# Patient Record
Sex: Female | Born: 1975 | Race: White | Hispanic: Yes | Marital: Married | Smoking: Never smoker
Health system: Southern US, Community
[De-identification: ages and names within clinical notes are randomized; demographics above are authoritative.]

## PROBLEM LIST (undated history)

## (undated) DIAGNOSIS — K219 Gastro-esophageal reflux disease without esophagitis: Secondary | ICD-10-CM

## (undated) DIAGNOSIS — K5900 Constipation, unspecified: Secondary | ICD-10-CM

## (undated) HISTORY — DX: Constipation, unspecified: K59.00

## (undated) HISTORY — PX: CHOLECYSTECTOMY: SHX55

---

## 2004-12-22 ENCOUNTER — Ambulatory Visit (HOSPITAL_COMMUNITY): Admission: RE | Admit: 2004-12-22 | Discharge: 2004-12-22 | Payer: Self-pay | Admitting: Family Medicine

## 2004-12-23 ENCOUNTER — Emergency Department (HOSPITAL_COMMUNITY): Admission: EM | Admit: 2004-12-23 | Discharge: 2004-12-23 | Payer: Self-pay | Admitting: Emergency Medicine

## 2004-12-24 ENCOUNTER — Ambulatory Visit (HOSPITAL_COMMUNITY): Admission: RE | Admit: 2004-12-24 | Discharge: 2004-12-24 | Payer: Self-pay | Admitting: Family Medicine

## 2005-11-17 ENCOUNTER — Ambulatory Visit: Payer: Self-pay | Admitting: Internal Medicine

## 2005-11-21 ENCOUNTER — Ambulatory Visit (HOSPITAL_COMMUNITY): Admission: RE | Admit: 2005-11-21 | Discharge: 2005-11-21 | Payer: Self-pay | Admitting: Internal Medicine

## 2005-11-22 ENCOUNTER — Ambulatory Visit: Payer: Self-pay | Admitting: Internal Medicine

## 2005-11-22 ENCOUNTER — Ambulatory Visit (HOSPITAL_COMMUNITY): Admission: RE | Admit: 2005-11-22 | Discharge: 2005-11-22 | Payer: Self-pay | Admitting: Internal Medicine

## 2005-12-27 ENCOUNTER — Ambulatory Visit: Payer: Self-pay | Admitting: Internal Medicine

## 2006-10-20 ENCOUNTER — Ambulatory Visit (HOSPITAL_COMMUNITY): Admission: RE | Admit: 2006-10-20 | Discharge: 2006-10-20 | Payer: Self-pay | Admitting: Gastroenterology

## 2011-12-25 ENCOUNTER — Encounter (HOSPITAL_COMMUNITY): Payer: Self-pay | Admitting: Emergency Medicine

## 2011-12-25 ENCOUNTER — Emergency Department (HOSPITAL_COMMUNITY)
Admission: EM | Admit: 2011-12-25 | Discharge: 2011-12-25 | Disposition: A | Discharge status: Home / Self Care | Payer: No Typology Code available for payment source | Attending: Emergency Medicine | Admitting: Emergency Medicine

## 2011-12-25 ENCOUNTER — Emergency Department (HOSPITAL_COMMUNITY): Payer: No Typology Code available for payment source

## 2011-12-25 DIAGNOSIS — K625 Hemorrhage of anus and rectum: Secondary | ICD-10-CM | POA: Insufficient documentation

## 2011-12-25 DIAGNOSIS — N72 Inflammatory disease of cervix uteri: Secondary | ICD-10-CM | POA: Insufficient documentation

## 2011-12-25 DIAGNOSIS — K219 Gastro-esophageal reflux disease without esophagitis: Secondary | ICD-10-CM | POA: Insufficient documentation

## 2011-12-25 DIAGNOSIS — R6883 Chills (without fever): Secondary | ICD-10-CM | POA: Insufficient documentation

## 2011-12-25 DIAGNOSIS — R109 Unspecified abdominal pain: Secondary | ICD-10-CM | POA: Insufficient documentation

## 2011-12-25 DIAGNOSIS — K644 Residual hemorrhoidal skin tags: Secondary | ICD-10-CM | POA: Insufficient documentation

## 2011-12-25 HISTORY — DX: Gastro-esophageal reflux disease without esophagitis: K21.9

## 2011-12-25 LAB — COMPREHENSIVE METABOLIC PANEL
ALT: 20 U/L (ref 0–35)
Alkaline Phosphatase: 52 U/L (ref 39–117)
BUN: 6 mg/dL (ref 6–23)
CO2: 24 mEq/L (ref 19–32)
Calcium: 9.6 mg/dL (ref 8.4–10.5)
GFR calc Af Amer: >90 mL/min (ref >90)
GFR calc non Af Amer: >90 mL/min (ref >90)
Glucose, Bld: 95 mg/dL (ref 70–99)
Sodium: 138 mEq/L (ref 135–145)
Total Protein: 7.7 g/dL (ref 6.0–8.3)

## 2011-12-25 LAB — URINALYSIS, MICROSCOPIC ONLY
Bilirubin Urine: NEGATIVE
Hgb urine dipstick: NEGATIVE
Nitrite: NEGATIVE
Protein, ur: NEGATIVE mg/dL
Urobilinogen, UA: 0.2 mg/dL (ref 0.0–1.0)

## 2011-12-25 LAB — CBC
HCT: 39.7 % (ref 36.0–46.0)
Hemoglobin: 13.8 g/dL (ref 12.0–15.0)
MCH: 32.3 pg (ref 26.0–34.0)
MCV: 93 fL (ref 78.0–100.0)
Platelets: 210 10*3/uL (ref 150–400)
RBC: 4.27 MIL/uL (ref 3.87–5.11)
WBC: 5 10*3/uL (ref 4.0–10.5)

## 2011-12-25 LAB — WET PREP, GENITAL: Clue Cells Wet Prep HPF POC: NONE SEEN

## 2011-12-25 LAB — DIFFERENTIAL
Eosinophils Absolute: 0 10*3/uL (ref 0.0–0.7)
Eosinophils Relative: 0 % (ref 0–5)
Lymphocytes Relative: 24 % (ref 12–46)
Lymphs Abs: 1.2 10*3/uL (ref 0.7–4.0)
Monocytes Absolute: 0.2 10*3/uL (ref 0.1–1.0)
Monocytes Relative: 4 % (ref 3–12)

## 2011-12-25 LAB — LACTIC ACID, PLASMA: Lactic Acid, Venous: 0.9 mmol/L (ref 0.5–2.2)

## 2011-12-25 LAB — OCCULT BLOOD, POC DEVICE: Fecal Occult Bld: NEGATIVE

## 2011-12-25 MED ORDER — AZITHROMYCIN 1 G PO PACK
1.0000 g | PACK | Freq: Once | ORAL | Status: AC
  Administered 2011-12-25: 1 g via ORAL
  Filled 2011-12-25: qty 1

## 2011-12-25 MED ORDER — HYDROMORPHONE HCL PF 1 MG/ML IJ SOLN
1.0000 mg | Freq: Once | INTRAMUSCULAR | Status: AC
  Administered 2011-12-25: 1 mg via INTRAVENOUS
  Filled 2011-12-25 (×2): qty 1

## 2011-12-25 MED ORDER — SODIUM CHLORIDE 0.9 % IV BOLUS (SEPSIS)
1000.0000 mL | Freq: Once | INTRAVENOUS | Status: AC
  Administered 2011-12-25: 1000 mL via INTRAVENOUS

## 2011-12-25 MED ORDER — IOHEXOL 300 MG/ML  SOLN
20.0000 mL | INTRAMUSCULAR | Status: AC
  Administered 2011-12-25: 20 mL via ORAL

## 2011-12-25 MED ORDER — ONDANSETRON HCL 4 MG/2ML IJ SOLN
4.0000 mg | Freq: Once | INTRAMUSCULAR | Status: AC
  Administered 2011-12-25: 4 mg via INTRAVENOUS
  Filled 2011-12-25 (×2): qty 2

## 2011-12-25 MED ORDER — CEFTRIAXONE SODIUM 250 MG IJ SOLR
250.0000 mg | Freq: Once | INTRAMUSCULAR | Status: AC
  Administered 2011-12-25: 250 mg via INTRAMUSCULAR
  Filled 2011-12-25: qty 250

## 2011-12-25 MED ORDER — PRAMOXINE-ZINC OXIDE IN MO 1-12.5 % RE OINT: TOPICAL_OINTMENT | RECTAL | Status: AC

## 2011-12-25 MED ORDER — POLYETHYLENE GLYCOL 3350 17 G PO PACK: 17.0000 g | PACK | Freq: Every day | ORAL | Status: AC

## 2011-12-25 MED ORDER — IOHEXOL 300 MG/ML  SOLN: 100.0000 mL | Freq: Once | INTRAMUSCULAR | Status: DC | PRN

## 2011-12-25 NOTE — ED Notes (Signed)
Patient is resting comfortably. Reports pain has improved. Appears more comfortable. Bolus complete and patient noted in no acute distress. Family at bedside.

## 2011-12-25 NOTE — ED Notes (Signed)
Pt drinking oral contrast at this time. 

## 2011-12-25 NOTE — ED Notes (Signed)
MD at bedside. 

## 2011-12-25 NOTE — ED Notes (Signed)
Assisted Dr. Meredith Pel with pelvic exam.  Pt updated on plan of care and pending labs d/c.  Pt noted in no acute distress.

## 2011-12-25 NOTE — ED Notes (Signed)
C/o pelvic pain since yesterday that is worse today.   Reports problems with constipation over the past month and is scheduled to see gastroenterologist later in the month.  Pt states she was trying to have BM and had small amount of bright red rectal bleeding with mucous in it.

## 2011-12-25 NOTE — ED Provider Notes (Signed)
History     CSN: [id number]  Arrival date & time 12/25/11  1556   First MD Initiated Contact with Patient 12/25/11 1703      Chief Complaint  Patient presents with  . Rectal Bleeding    (Consider location/radiation/quality/duration/timing/severity/associated sxs/prior treatment) Patient is a 36 y.o. female presenting with hematochezia. The history is provided by the patient.  Rectal Bleeding  The current episode started today. Episode frequency: once. The problem has been unchanged. The pain is moderate. The stool is described as mixed with blood and soft. Associated symptoms include abdominal pain (lower abdominal) and rectal pain. Pertinent negatives include no fever, no diarrhea, no hematemesis, no hemorrhoids, no nausea, no vomiting, no hematuria, no vaginal bleeding, no vaginal discharge, no chest pain, no headaches, no coughing, no difficulty breathing and no rash. She has been behaving normally. She has been eating and drinking normally. Urine output has been normal. Her past medical history is significant for abdominal surgery (cholecystectomy). Her past medical history does not include recent abdominal injury. Past medical history comments: "colitis". There were no sick contacts. She has received no recent medical care.    Past Medical History  Diagnosis Date  . Acid reflux     Past Surgical History  Procedure Date  . Cholecystectomy     No family history on file.  History  Substance Use Topics  . Smoking status: Never Smoker   . Smokeless tobacco: Not on file  . Alcohol Use: No    OB History    Grav Para Term Preterm Abortions TAB SAB Ect Mult Living                  Review of Systems  Constitutional: Positive for chills. Negative for fever, activity change, appetite change and fatigue.  HENT: Negative for congestion, sore throat, rhinorrhea, neck pain and neck stiffness.   Eyes: Negative for photophobia, redness and visual disturbance.  Respiratory:  Negative for cough, shortness of breath and wheezing.   Cardiovascular: Negative for chest pain, palpitations and leg swelling.  Gastrointestinal: Positive for abdominal pain (lower abdominal), blood in stool (mucousy stool today with small amount of bright red blood streaking), hematochezia and rectal pain. Negative for nausea, vomiting, diarrhea, constipation, hematemesis and hemorrhoids.  Genitourinary: Positive for dyspareunia (chronically for years). Negative for dysuria, urgency, hematuria, flank pain, vaginal bleeding and vaginal discharge.  Musculoskeletal: Negative for back pain.  Skin: Negative for rash and wound.  Neurological: Negative for dizziness, seizures, facial asymmetry, speech difficulty, weakness, light-headedness, numbness and headaches.  Psychiatric/Behavioral: Negative for confusion.  All other systems reviewed and are negative.    Allergies  Review of patient's allergies indicates no known allergies.  Home Medications  No current outpatient prescriptions on file.  BP 123/79  Pulse 101  Resp 20  SpO2 100%  LMP 12/04/2011  Physical Exam  Nursing note and vitals reviewed. Constitutional: She is oriented to person, place, and time. She appears well-developed and well-nourished.  Non-toxic appearance. No distress.  HENT:  Head: Normocephalic and atraumatic.  Mouth/Throat: Oropharynx is clear and moist.  Eyes: Conjunctivae and EOM are normal. Pupils are equal, round, and reactive to light. No scleral icterus.  Neck: Normal range of motion. Neck supple. No JVD present.  Cardiovascular: Normal rate, regular rhythm, normal heart sounds and intact distal pulses.   No murmur heard. Pulmonary/Chest: Effort normal and breath sounds normal. No respiratory distress. She has no wheezes. She has no rales.  Abdominal: Soft. Bowel sounds are normal. She  exhibits no distension and no mass. There is tenderness (pain diffusely accross lower abdomen but worse in LLQ). There is no  rebound and no guarding.  Genitourinary: Uterus normal. Rectal exam shows external hemorrhoid (5mm external hemorrhoid at 7 o'clock position). Rectal exam shows no fissure, no mass, no tenderness and anal tone normal. There is no rash, tenderness or lesion on the right labia. There is no rash, tenderness or lesion on the left labia. Cervix exhibits discharge (white) and friability. Cervix exhibits no motion tenderness. Right adnexum displays no mass and no tenderness. Left adnexum displays no mass and no tenderness.  Musculoskeletal: Normal range of motion.  Neurological: She is alert and oriented to person, place, and time. She has normal strength. No cranial nerve deficit. GCS eye subscore is 4. GCS verbal subscore is 5. GCS motor subscore is 6.  Skin: Skin is warm and dry. No rash noted. She is not diaphoretic.  Psychiatric: She has a normal mood and affect.    ED Course  Procedures (including critical care time)  Labs Reviewed  URINALYSIS, WITH MICROSCOPIC - Abnormal; Notable for the following:    Specific Gravity, Urine 1.002 (*)    All other components within normal limits  WET PREP, GENITAL - Abnormal; Notable for the following:    WBC, Wet Prep HPF POC FEW (*)    All other components within normal limits  CBC  DIFFERENTIAL  COMPREHENSIVE METABOLIC PANEL  LACTIC ACID, PLASMA  POCT PREGNANCY, URINE  OCCULT BLOOD, POC DEVICE  GC/CHLAMYDIA PROBE AMP, GENITAL   Ct Abdomen Pelvis W Contrast  12/25/2011  *RADIOLOGY REPORT*  Clinical Data: Rectal bleeding.  CT ABDOMEN AND PELVIS WITH CONTRAST  Technique:  Multidetector CT imaging of the abdomen and pelvis was performed following the standard protocol during bolus administration of intravenous contrast.  Contrast: 1 OMNIPAQUE IOHEXOL 300 MG/ML IV SOLN  Comparison: 11/21/2005  Findings: The lung bases are clear.  The liver is unremarkable.  No focal lesions or biliary dilatation. The gallbladder is surgically absent.  No common bile duct  dilatation.  The pancreas is normal.  The spleen is normal in size. There is a stable splenic cleft.  The adrenal glands and kidneys are normal.  No hydronephrosis or renal or ureteral calculi.  The stomach, duodenum, small bowel and colon are unremarkable. The appendix is normal.  No inflammatory changes or mass lesions to account for the patient's rectal bleeding.  No mesenteric or retroperitoneal masses or adenopathy.  The aorta is normal in caliber.  The major branch vessels are normal.  The uterus and ovaries are unremarkable.  No pelvic mass, adenopathy or free pelvic fluid collections.  The bladder is normal.  No inguinal mass or hernia.  The bony pelvis is intact.  IMPRESSION: No acute abdominal/pelvic findings, mass lesions or adenopathy.  Original Report Authenticated By: P. Loralie Champagne, M.D.     1. External hemorrhoid   2. Cervicitis       MDM  35yo HF with PMH significant for GERD and "colitis" (diagnosed in [place]) presents to the ED due to lower abdominal pain, chills, and mucousy stool today with blood streaking in stool. Has hx of same with "colitis". Also reports chronic pelvic pain with intercourse unchanged for years now. No dysuria or vaginal discharge. LMP 3wks ago. Abdominal exam concerning for possible diverticulitis.   Rectal and pelvic exam performed with female RN chaperone. Rectal exam with small non-thrombosed external hemorrhoid, no bleeding. No fissures. No rectal masses. Cervix with erosions  and friability with some moderate amount of clear-white endocervical discharge. Suspect that the blood streaking she saw on after her BM was actually from her vagina as cervix is friable and with slight bleeding. Discussed results with pt. Will tx empirically for cervicitis. No hx abnormal pap but no pap in 3y. Informed her she will need OBGYN f/u and she will call her obgyn in AM. She is scheduled for GI f/u in approx 2 wks. Will cover with rocephin and azith. GC chlam tests sent.     CT scan neg for diverticulitis or other abnormality.   I saw and evaluated the patient, reviewed the resident's note and I agree with the findings and plan. Pt was evaluated for rectal bleeding.  Abdominal exam is negative.  Lab workup is benign.  Pelvic exam suggests blood was from cervicitis. Osvaldo Human, M.D.     Verne Carrow, MD 12/25/11 2357  Carleene Cooper III, MD 12/27/11 1106

## 2011-12-25 NOTE — ED Notes (Signed)
Dr Ignacia Palma to bedside.

## 2011-12-27 LAB — GC/CHLAMYDIA PROBE AMP, GENITAL: Chlamydia, DNA Probe: NEGATIVE

## 2015-09-30 ENCOUNTER — Encounter: Payer: Self-pay | Admitting: Internal Medicine

## 2015-10-21 ENCOUNTER — Encounter: Payer: Self-pay | Admitting: Gastroenterology

## 2015-10-21 ENCOUNTER — Ambulatory Visit (INDEPENDENT_AMBULATORY_CARE_PROVIDER_SITE_OTHER): Payer: BLUE CROSS/BLUE SHIELD | Admitting: Gastroenterology

## 2015-10-21 ENCOUNTER — Telehealth: Payer: Self-pay | Admitting: Gastroenterology

## 2015-10-21 ENCOUNTER — Other Ambulatory Visit: Payer: Self-pay

## 2015-10-21 VITALS — BP 113/82 | HR 96 | Temp 97.6°F | Ht 66.0 in | Wt 220.0 lb

## 2015-10-21 DIAGNOSIS — K625 Hemorrhage of anus and rectum: Secondary | ICD-10-CM

## 2015-10-21 DIAGNOSIS — K6289 Other specified diseases of anus and rectum: Secondary | ICD-10-CM

## 2015-10-21 DIAGNOSIS — G8929 Other chronic pain: Secondary | ICD-10-CM | POA: Diagnosis not present

## 2015-10-21 DIAGNOSIS — K219 Gastro-esophageal reflux disease without esophagitis: Secondary | ICD-10-CM | POA: Diagnosis not present

## 2015-10-21 MED ORDER — PANTOPRAZOLE SODIUM 40 MG PO TBEC: 40.0000 mg | DELAYED_RELEASE_TABLET | Freq: Every day | ORAL | Status: DC

## 2015-10-21 NOTE — Patient Instructions (Addendum)
We have scheduled you for a colonoscopy with Dr. Jena Gaussourk in the near future.  [facility] is compounding a special cream for your rectum to use 3-4 times a day. Make sure you wear gloves and wash your hands after applying, as it has nitroglycerin in it. This could cause dizziness or headaches.   I have given you samples of Linzess to take once each morning, 30 minutes before breakfast. Please DON'T START until I clear this with a women's health professional regarding breastfeeding and this medication.  I also think you need a stronger reflux medication. As you are breastfeeding, I will find out the best choice for you to use and let you know as soon as possible.   Food Choices for Gastroesophageal Reflux Disease, Adult When you have gastroesophageal reflux disease (GERD), the foods you eat and your eating habits are very important. Choosing the right foods can help ease the discomfort of GERD. WHAT GENERAL GUIDELINES DO I NEED TO FOLLOW?  Choose fruits, vegetables, whole grains, low-fat dairy products, and low-fat meat, fish, and poultry.  Limit fats such as oils, salad dressings, butter, nuts, and avocado.  Keep a food diary to identify foods that cause symptoms.  Avoid foods that cause reflux. These may be different for different people.  Eat frequent small meals instead of three large meals each day.  Eat your meals slowly, in a relaxed setting.  Limit fried foods.  Cook foods using methods other than frying.  Avoid drinking alcohol.  Avoid drinking large amounts of liquids with your meals.  Avoid bending over or lying down until 2-3 hours after eating. WHAT FOODS ARE NOT RECOMMENDED? The following are some foods and drinks that may worsen your symptoms: Vegetables Tomatoes. Tomato juice. Tomato and spaghetti sauce. Chili peppers. Onion and garlic. Horseradish. Fruits Oranges, grapefruit, and lemon (fruit and juice). Meats High-fat meats, fish, and poultry. This  includes hot dogs, ribs, ham, sausage, salami, and bacon. Dairy Whole milk and chocolate milk. Sour cream. Cream. Butter. Ice cream. Cream cheese.  Beverages Coffee and tea, with or without caffeine. Carbonated beverages or energy drinks. Condiments Hot sauce. Barbecue sauce.  Sweets/Desserts Chocolate and cocoa. Donuts. Peppermint and spearmint. Fats and Oils High-fat foods, including [place] fries and potato chips. Other Vinegar. Strong spices, such as black pepper, white pepper, red pepper, cayenne, curry powder, cloves, ginger, and chili powder. The items listed above may not be a complete list of foods and beverages to avoid. Contact your dietitian for more information.   This information is not intended to replace advice given to you by your health care provider. Make sure you discuss any questions you have with your health care provider.   Document Released: 10/31/2005 Document Revised: 11/21/2014 Document Reviewed: 09/04/2013 Elsevier Interactive Patient Education [employer]2016 Elsevier Inc.

## 2015-10-21 NOTE — Progress Notes (Signed)
Primary Care Physician:  Ardyth ManPARK,CHAN M, MD Primary Gastroenterologist:  Dr. Jena Gaussourk   Chief Complaint  Patient presents with  . Rectal Pain  . Hemorrhoids  . Rectal Bleeding    in the past    HPI:   Kathryn Hurley is a 39 y.o. female presenting today at the request of her PCP secondary to rectal pain, rectal bleeding.   Has had rectal pain for several years but now has pain in lower abdomen as well. Has had low-volume hematochezia intermittently but not lately. Rectal pain is present most of the time. States it hurts worse if drinks coffee. Stool has "lines" in them. Occasional constipation. Right now has BM daily. Used go 2-3 X per day but now just once.Feels like it is not as productive as it should be. Avoiding straining. Lower abdominal discomfort is better after good BM but a good BM is rare. Sometimes can't even sleep at night from the pain. Knife-like pain in rectum with defecation. Almost feels like blisters. No unexplained weight loss or lack of appetite. Has reflux, upper abdominal discomfort at times. Not on a PPI currently. Was told to take Tums. Taken Omeprazole prn in the past. Was prescribed Linzess but doesn't know if it has helped much. Has taken fiber in the past. Has taken OTC agents before. Last colonoscopy possibly 10 years ago but doesn't remember where. Has seen a gynecologist without any concerning findings as to etiology for lower abdominal pain.. Last CT in 2013 without any concerning findings. Was having abdominal pain at that time, too. [place] but no procedures completed. Has requested a colonoscopy on multiple occasions but never was scheduled as she was told this was not necessary.   SHE IS BREASTFEEDING HER 5518 month old once each evening.    Past Medical History  Diagnosis Date  . Acid reflux   . Constipation     Past Surgical History  Procedure Laterality Date  . Cholecystectomy      No current outpatient prescriptions on file.   No  current facility-administered medications for this visit.    Allergies as of 10/21/2015  . (No Known Allergies)    Family History  Problem Relation Age of Onset  . Colon cancer Neg Hx     Social History   Social History  . Marital Status: Married    Spouse Name: N/A  . Number of Children: N/A  . Years of Education: N/A   Occupational History  . Not on file.   Social History Main Topics  . Smoking status: Never Smoker   . Smokeless tobacco: Not on file  . Alcohol Use: No  . Drug Use: No  . Sexual Activity: Not on file   Other Topics Concern  . Not on file   Social History Narrative    Review of Systems: Gen: Denies any fever, chills, fatigue, weight loss, lack of appetite.  CV: Denies chest pain, heart palpitations, peripheral edema, syncope.  Resp: Denies shortness of breath at rest or with exertion. Denies wheezing or cough.  GI: see HPI  GU : Denies urinary burning, urinary frequency, urinary hesitancy MS: Denies joint pain, muscle weakness, cramps, or limitation of movement.  Derm: Denies rash, itching, dry skin Psych: Denies depression, anxiety, memory loss, and confusion Heme: Denies bruising, bleeding, and enlarged lymph nodes.  Physical Exam: BP 113/82 mmHg  Pulse 96  Temp(Src) 97.6 F (36.4 C)  Ht 5\' 6"  (1.676 m)  Wt 220 lb (99.791 kg)  BMI 35.53 kg/m2  LMP 10/05/2015 General:   Alert and oriented. Pleasant and cooperative. Well-nourished and well-developed.  Head:  Normocephalic and atraumatic. Eyes:  Without icterus, sclera clear and conjunctiva pink.  Ears:  Normal auditory acuity. Nose:  No deformity, discharge,  or lesions. Mouth:  No deformity or lesions, oral mucosa pink.  Lungs:  Clear to auscultation bilaterally. No wheezes, rales, or rhonchi. No distress.  Heart:  S1, S2 present without murmurs appreciated.  Abdomen:  +BS, soft, non-tender and non-distended. No HSM noted. No guarding or rebound. No masses appreciated.  Rectal:   Deferred  Msk:  Symmetrical without gross deformities. Normal posture. Extremities:  Without  edema. Neurologic:  Alert and  oriented x4;  grossly normal neurologically. Skin:  Intact without significant lesions or rashes. Psych:  Alert and cooperative. Normal mood and affect.

## 2015-10-21 NOTE — Telephone Encounter (Signed)
Please let patient know it is ok to start taking linzess 290 mcg once each day, 30 minutes before breakfast.  I also sent in Protonix to take once each morning, 30 minutes before breakfast.   [facility]

## 2015-10-21 NOTE — Assessment & Plan Note (Signed)
39 year old female with chronic rectal pain, worse with defecation, always present. Intermittent low-volume hematochezia noted. No prior colonoscopy. Query anal fissure, hemorrhoids, unable to exclude other occult etiology. Some unproductive BMs and occasional constipation noted. SHE IS BREASTFEEDING HER 718 month old just in the evening. I have reviewed Linzess and Protonix with a fellow midwife colleague who routinely sees post-partum women. Protonix should be safe for breastfeeding (see GERD); Linzess does not appear to be present in breastmilk. I discussed with patient that after the procedure, she should probably not breastfeed for 24-48 hours.    Proceed with TCS with Dr. Jena Gaussourk in near future: the risks, benefits, and alternatives have been discussed with the patient in detail. The patient states understanding and desires to proceed. Linzess 290 mcg samples provided to patient [facility] hemorrhoid cream with nitro compounded 3-4 times per day prescribed

## 2015-10-21 NOTE — Telephone Encounter (Signed)
Tried to call pt x 2, number has been disconnected.

## 2015-10-21 NOTE — Assessment & Plan Note (Signed)
Intermittent GERD and mild epigastric discomfort in setting of chronic GERD without PPI. No alarm features such as weight loss, N/V, dysphagia. Will trial Protonix once daily. No need for EGD unless persistent symptoms.

## 2015-10-21 NOTE — Assessment & Plan Note (Signed)
Colonoscopy as planned.  

## 2015-10-22 NOTE — Telephone Encounter (Signed)
Letter mailed to the pt. Please schedule ov. 

## 2015-10-22 NOTE — Progress Notes (Signed)
cc'ed to pcp °

## 2015-10-23 ENCOUNTER — Encounter (HOSPITAL_COMMUNITY)
Admission: RE | Disposition: A | Discharge status: Home / Self Care | Payer: Self-pay | Source: Ambulatory Visit | Attending: Internal Medicine

## 2015-10-23 ENCOUNTER — Encounter (HOSPITAL_COMMUNITY): Payer: Self-pay | Admitting: *Deleted

## 2015-10-23 ENCOUNTER — Telehealth: Payer: Self-pay | Admitting: Nurse Practitioner

## 2015-10-23 ENCOUNTER — Ambulatory Visit (HOSPITAL_COMMUNITY)
Admission: RE | Admit: 2015-10-23 | Discharge: 2015-10-23 | Disposition: A | Discharge status: Home / Self Care | Payer: BLUE CROSS/BLUE SHIELD | Source: Ambulatory Visit | Attending: Internal Medicine | Admitting: Internal Medicine

## 2015-10-23 ENCOUNTER — Encounter: Payer: Self-pay | Admitting: Internal Medicine

## 2015-10-23 SURGERY — COLONOSCOPY
Anesthesia: Moderate Sedation

## 2015-10-23 NOTE — Telephone Encounter (Signed)
Please call Shawna OrleansMelanie in End[place] on Monday to reschedule this patient. Was on with RMR today (Friday 10/23/15) but ate 3 full meals the day before.

## 2015-10-23 NOTE — Telephone Encounter (Signed)
APPT MADE AND LETTER SENT  °

## 2015-10-23 NOTE — OR Nursing (Signed)
Patient presented for colonoscopy and had eaten full meals at lunch ( Cong[place]inese food with lo mein, rice, broccoli, egg roll, and chicken ) and dinner (pork, pinto beans, cheese and a tortilla) and a honey bun for a snack yesterday.  She did complete her Moviprep split prep and enema stating that she did not understand that she could not have food.  Procedure cancelled per Dr Jena Gaussourk.  Patient voiced understanding regarding the cancellation and Melanine will follow up Monday with the office.

## 2015-11-02 ENCOUNTER — Other Ambulatory Visit: Payer: Self-pay

## 2015-11-02 DIAGNOSIS — G8929 Other chronic pain: Secondary | ICD-10-CM

## 2015-11-02 DIAGNOSIS — K6289 Other specified diseases of anus and rectum: Principal | ICD-10-CM

## 2015-11-02 MED ORDER — PEG 3350-KCL-NA BICARB-NACL 420 G PO SOLR: 4000.0000 mL | ORAL | Status: DC

## 2015-11-02 NOTE — Telephone Encounter (Signed)
PT IS RESCHEDULED FOR 11/18/15. SHE IS AWARE

## 2015-11-17 ENCOUNTER — Telehealth: Payer: Self-pay

## 2015-11-17 NOTE — Telephone Encounter (Signed)
Pt called to cancel her TCS because she has a family emergency. She will call us when she gets back in town.

## 2015-11-18 ENCOUNTER — Ambulatory Visit (HOSPITAL_COMMUNITY)
Admission: RE | Admit: 2015-11-18 | Payer: BLUE CROSS/BLUE SHIELD | Source: Ambulatory Visit | Admitting: Internal Medicine

## 2015-11-18 ENCOUNTER — Encounter (HOSPITAL_COMMUNITY): Admission: RE | Payer: Self-pay | Source: Ambulatory Visit

## 2015-11-18 SURGERY — COLONOSCOPY
Anesthesia: Moderate Sedation

## 2015-11-19 NOTE — Telephone Encounter (Signed)
Noted. Thanks.

## 2015-12-01 ENCOUNTER — Ambulatory Visit: Payer: BLUE CROSS/BLUE SHIELD | Admitting: Gastroenterology

## 2015-12-18 ENCOUNTER — Ambulatory Visit (INDEPENDENT_AMBULATORY_CARE_PROVIDER_SITE_OTHER): Payer: BLUE CROSS/BLUE SHIELD | Admitting: Gastroenterology

## 2015-12-18 ENCOUNTER — Encounter: Payer: Self-pay | Admitting: Gastroenterology

## 2015-12-18 VITALS — BP 108/73 | HR 93 | Temp 97.6°F | Ht 66.0 in | Wt 220.8 lb

## 2015-12-18 DIAGNOSIS — R1013 Epigastric pain: Secondary | ICD-10-CM | POA: Diagnosis not present

## 2015-12-18 DIAGNOSIS — K625 Hemorrhage of anus and rectum: Secondary | ICD-10-CM | POA: Diagnosis not present

## 2015-12-18 NOTE — Progress Notes (Signed)
    Referring Provider: Ardyth Man, MD Primary Care Physician:  Ardyth Man, MD  Primary GI: Dr. Jena Gauss   Chief Complaint  Patient presents with  . Follow-up  . set up TCS    HPI:   Kathryn Hurley is a 40 y.o. female presenting today with a history of rectal pain and rectal bleeding. Originally seen in Dec 2016 and colonoscopy arranged; however, she had to cancel as she had misunderstood the prep instructions.  Rectal pain worse with defecation, constant. Intermittent low-volume hematochezia. Occasional constipation noted. Started on Linzess and [facility] hemorrhoid cream with nitro compounded in cream sent in Dec 2016. She has had intermittent GERD, mild epigastric discomfort in setting of chronic GERD without PPI in Dec 2016. Started Protonix at last visit with hold on EGD unless persistent symptoms.   [facility] hemorrhoid cream has helped a lot. Protonix has improved symptoms of dyspepsia but still present. No N/V. Spicy foods worsen symptoms, and she has tried to avoid trigger foods. Still with some intermittent, vague LUQ discomfort despite dietary changes. Rare NSAIDs. Try not to take a lot. Avoiding trigger foods, coffee, spicy.   Past Medical History  Diagnosis Date  . Acid reflux   . Constipation     Past Surgical History  Procedure Laterality Date  . Cholecystectomy      Current Outpatient Prescriptions  Medication Sig Dispense Refill  . pantoprazole (PROTONIX) 40 MG tablet Take 1 tablet (40 mg total) by mouth daily. 90 tablet 3   No current facility-administered medications for this visit.    Allergies as of 12/18/2015  . (No Known Allergies)    Family History  Problem Relation Age of Onset  . Colon cancer Neg Hx     Social History   Social History  . Marital Status: Married    Spouse Name: N/A  . Number of Children: N/A  . Years of Education: N/A   Social History Main Topics  . Smoking status: Never Smoker   . Smokeless tobacco:  None  . Alcohol Use: No  . Drug Use: No  . Sexual Activity: Not Asked   Other Topics Concern  . None   Social History Narrative    Review of Systems: Negative unless mentioned in HPI   Physical Exam: BP 108/73 mmHg  Pulse 93  Temp(Src) 97.6 F (36.4 C)  Ht  (1.676 m)  Wt 220 lb 12.8 oz (100.154 kg)  BMI 35.65 kg/m2  LMP 12/07/2015 General:   Alert and oriented. No distress noted. Pleasant and cooperative.  Head:  Normocephalic and atraumatic. Eyes:  Conjuctiva clear without scleral icterus. Mouth:  Oral mucosa pink and moist. Good dentition. No lesions. Heart:  S1, S2 present without murmurs, rubs, or gallops. Regular rate and rhythm. Abdomen:  +BS, soft, non-tender and non-distended. No rebound or guarding. No HSM or masses noted. Rectal: patient declined  Msk:  Symmetrical without gross deformities. Normal posture. Extremities:  Without edema. Neurologic:  Alert and  oriented x4;  grossly normal neurologically. Psych:  Alert and cooperative. Normal mood and affect.

## 2015-12-18 NOTE — Assessment & Plan Note (Signed)
Persistent reflux symptoms and vague LUQ discomfort with just mild improvement on Protonix. Gallbladder absent. No dysphagia. As she has had persistent symptoms despite dietary/behavior modification and PPI, will proceed with EGD at time of colonoscopy to assess for gastritis, PUD  Proceed with upper endoscopy in the near future with Dr. Jena Gauss. The risks, benefits, and alternatives have been discussed in detail with patient. They have stated understanding and desire to proceed.  Continue appropriate lifestyle changes Continue Protonix once daily

## 2015-12-18 NOTE — Patient Instructions (Signed)
Continue to take Protonix once each morning, 30 minutes before breakfast.   You may restart Linzess when you feel you need it.   For the rectal cream, use once in the morning and once at night if needed. You should breastfeed your baby before applying the cream for the evening dose. It is unlikely there is any systemic absorption, but I talked with the pharmacist who says this may be the best way to do it for now.   We have scheduled you for a colonoscopy and upper endoscopy in the near future with Dr. Jena Gauss.

## 2015-12-18 NOTE — Progress Notes (Signed)
CC'ED TO PCP 

## 2015-12-18 NOTE — Assessment & Plan Note (Signed)
40 year old female with chronic rectal pain and bleeding that may be benign source but unable to exclude other occult etiology; query hemorrhoids or fissure. She has noted significant improvement in rectal pain with [facility] Hemorrhoid cream that has nitro compounded into this. I did discuss with the pharmacist about safety with this topical medication in the setting of evening breastfeeding of 15 month old. Low likelihood of any systemic absorption and with very short half-life, it is not felt to be contraindicated. To ensure safety, she has been asked to use this AFTER breastfeeding in the evening if needed. We will arrange a colonoscopy in the near future for further recommendation. She declined a rectal exam today.   Proceed with TCS with Dr. Jena Gauss in near future: the risks, benefits, and alternatives have been discussed with the patient in detail. The patient states understanding and desires to proceed. Continue hemorrhoid cream Linzess 145 mcg daily if recurrent constipation Discussed not breastfeeding for 24-48 hours after procedure (child is 11 months old and only comfort feeds in the evening) May need hemorrhoid banding vs surgical repair depending on clinical findings

## 2015-12-22 ENCOUNTER — Other Ambulatory Visit: Payer: Self-pay

## 2015-12-22 DIAGNOSIS — R1013 Epigastric pain: Secondary | ICD-10-CM

## 2015-12-22 DIAGNOSIS — K625 Hemorrhage of anus and rectum: Secondary | ICD-10-CM

## 2015-12-22 MED ORDER — PEG 3350-KCL-NA BICARB-NACL 420 G PO SOLR: 4000.0000 mL | Freq: Once | ORAL | Status: DC

## 2016-01-08 ENCOUNTER — Encounter (HOSPITAL_COMMUNITY)
Admission: RE | Disposition: A | Discharge status: Home / Self Care | Payer: Self-pay | Source: Ambulatory Visit | Attending: Internal Medicine

## 2016-01-08 ENCOUNTER — Ambulatory Visit (HOSPITAL_COMMUNITY)
Admission: RE | Admit: 2016-01-08 | Discharge: 2016-01-08 | Disposition: A | Discharge status: Home / Self Care | Payer: BLUE CROSS/BLUE SHIELD | Source: Ambulatory Visit | Attending: Internal Medicine | Admitting: Internal Medicine

## 2016-01-08 ENCOUNTER — Encounter (HOSPITAL_COMMUNITY): Payer: Self-pay | Admitting: *Deleted

## 2016-01-08 DIAGNOSIS — R1013 Epigastric pain: Secondary | ICD-10-CM

## 2016-01-08 DIAGNOSIS — K449 Diaphragmatic hernia without obstruction or gangrene: Secondary | ICD-10-CM | POA: Insufficient documentation

## 2016-01-08 DIAGNOSIS — K6289 Other specified diseases of anus and rectum: Secondary | ICD-10-CM | POA: Diagnosis not present

## 2016-01-08 DIAGNOSIS — K5900 Constipation, unspecified: Secondary | ICD-10-CM | POA: Insufficient documentation

## 2016-01-08 DIAGNOSIS — K2950 Unspecified chronic gastritis without bleeding: Secondary | ICD-10-CM | POA: Diagnosis not present

## 2016-01-08 DIAGNOSIS — K921 Melena: Secondary | ICD-10-CM | POA: Insufficient documentation

## 2016-01-08 DIAGNOSIS — K5730 Diverticulosis of large intestine without perforation or abscess without bleeding: Secondary | ICD-10-CM | POA: Diagnosis not present

## 2016-01-08 DIAGNOSIS — Z79899 Other long term (current) drug therapy: Secondary | ICD-10-CM | POA: Insufficient documentation

## 2016-01-08 DIAGNOSIS — K3189 Other diseases of stomach and duodenum: Secondary | ICD-10-CM | POA: Diagnosis not present

## 2016-01-08 DIAGNOSIS — K219 Gastro-esophageal reflux disease without esophagitis: Secondary | ICD-10-CM | POA: Insufficient documentation

## 2016-01-08 DIAGNOSIS — K625 Hemorrhage of anus and rectum: Secondary | ICD-10-CM | POA: Diagnosis not present

## 2016-01-08 HISTORY — PX: BIOPSY: SHX5522

## 2016-01-08 HISTORY — PX: ESOPHAGOGASTRODUODENOSCOPY: SHX5428

## 2016-01-08 HISTORY — PX: COLONOSCOPY: SHX5424

## 2016-01-08 SURGERY — COLONOSCOPY
Anesthesia: Moderate Sedation

## 2016-01-08 MED ORDER — MEPERIDINE HCL 100 MG/ML IJ SOLN
INTRAMUSCULAR | Status: DC | PRN
  Administered 2016-01-08: 25 mg via INTRAVENOUS
  Administered 2016-01-08: 50 mg via INTRAVENOUS
  Administered 2016-01-08 (×2): 25 mg via INTRAVENOUS
  Administered 2016-01-08: 50 mg via INTRAVENOUS

## 2016-01-08 MED ORDER — SODIUM CHLORIDE 0.9 % IV SOLN
INTRAVENOUS | Status: DC
  Administered 2016-01-08: 07:00:00 via INTRAVENOUS

## 2016-01-08 MED ORDER — MEPERIDINE HCL 100 MG/ML IJ SOLN
INTRAMUSCULAR | Status: AC
  Filled 2016-01-08: qty 2

## 2016-01-08 MED ORDER — LIDOCAINE VISCOUS 2 % MT SOLN
OROMUCOSAL | Status: AC
  Filled 2016-01-08: qty 15

## 2016-01-08 MED ORDER — ONDANSETRON HCL 4 MG/2ML IJ SOLN
INTRAMUSCULAR | Status: AC
  Filled 2016-01-08: qty 2

## 2016-01-08 MED ORDER — MIDAZOLAM HCL 5 MG/5ML IJ SOLN
INTRAMUSCULAR | Status: DC | PRN
  Administered 2016-01-08 (×2): 1 mg via INTRAVENOUS
  Administered 2016-01-08 (×3): 2 mg via INTRAVENOUS
  Administered 2016-01-08: 1 mg via INTRAVENOUS

## 2016-01-08 MED ORDER — LIDOCAINE VISCOUS 2 % MT SOLN
OROMUCOSAL | Status: DC | PRN
  Administered 2016-01-08: 5 mL via OROMUCOSAL

## 2016-01-08 MED ORDER — ONDANSETRON HCL 4 MG/2ML IJ SOLN
INTRAMUSCULAR | Status: DC | PRN
  Administered 2016-01-08: 4 mg via INTRAVENOUS

## 2016-01-08 MED ORDER — MIDAZOLAM HCL 5 MG/5ML IJ SOLN
INTRAMUSCULAR | Status: AC
  Filled 2016-01-08: qty 10

## 2016-01-08 NOTE — H&P (View-Only) (Signed)
    Referring Provider: Ardyth Man, MD Primary Care Physician:  Ardyth Man, MD  Primary GI: Dr. Jena Gauss   Chief Complaint  Patient presents with  . Follow-up  . set up TCS    HPI:   Kathryn Hurley is a 40 y.o. female presenting today with a history of rectal pain and rectal bleeding. Originally seen in Dec 2016 and colonoscopy arranged; however, she had to cancel as she had misunderstood the prep instructions.  Rectal pain worse with defecation, constant. Intermittent low-volume hematochezia. Occasional constipation noted. Started on Linzess and [facility] hemorrhoid cream with nitro compounded in cream sent in Dec 2016. She has had intermittent GERD, mild epigastric discomfort in setting of chronic GERD without PPI in Dec 2016. Started Protonix at last visit with hold on EGD unless persistent symptoms.   [facility] hemorrhoid cream has helped a lot. Protonix has improved symptoms of dyspepsia but still present. No N/V. Spicy foods worsen symptoms, and she has tried to avoid trigger foods. Still with some intermittent, vague LUQ discomfort despite dietary changes. Rare NSAIDs. Try not to take a lot. Avoiding trigger foods, coffee, spicy.   Past Medical History  Diagnosis Date  . Acid reflux   . Constipation     Past Surgical History  Procedure Laterality Date  . Cholecystectomy      Current Outpatient Prescriptions  Medication Sig Dispense Refill  . pantoprazole (PROTONIX) 40 MG tablet Take 1 tablet (40 mg total) by mouth daily. 90 tablet 3   No current facility-administered medications for this visit.    Allergies as of 12/18/2015  . (No Known Allergies)    Family History  Problem Relation Age of Onset  . Colon cancer Neg Hx     Social History   Social History  . Marital Status: Married    Spouse Name: N/A  . Number of Children: N/A  . Years of Education: N/A   Social History Main Topics  . Smoking status: Never Smoker   . Smokeless tobacco:  None  . Alcohol Use: No  . Drug Use: No  . Sexual Activity: Not Asked   Other Topics Concern  . None   Social History Narrative    Review of Systems: Negative unless mentioned in HPI   Physical Exam: BP 108/73 mmHg  Pulse 93  Temp(Src) 97.6 F (36.4 C)  Ht  (1.676 m)  Wt 220 lb 12.8 oz (100.154 kg)  BMI 35.65 kg/m2  LMP 12/07/2015 General:   Alert and oriented. No distress noted. Pleasant and cooperative.  Head:  Normocephalic and atraumatic. Eyes:  Conjuctiva clear without scleral icterus. Mouth:  Oral mucosa pink and moist. Good dentition. No lesions. Heart:  S1, S2 present without murmurs, rubs, or gallops. Regular rate and rhythm. Abdomen:  +BS, soft, non-tender and non-distended. No rebound or guarding. No HSM or masses noted. Rectal: patient declined  Msk:  Symmetrical without gross deformities. Normal posture. Extremities:  Without edema. Neurologic:  Alert and  oriented x4;  grossly normal neurologically. Psych:  Alert and cooperative. Normal mood and affect.

## 2016-01-08 NOTE — Interval H&P Note (Signed)
History and Physical Interval Note:  01/08/2016 7:33 AM  Kathryn Hurley  has presented today for surgery, with the diagnosis of rectal bleeding, dyspepsia  The various methods of treatment have been discussed with the patient and family. After consideration of risks, benefits and other options for treatment, the patient has consented to  Procedure(s) with comments: COLONOSCOPY (N/A) - 0730 ESOPHAGOGASTRODUODENOSCOPY (EGD) (N/A) as a surgical intervention .  The patient's history has been reviewed, patient examined, no change in status, stable for surgery.  I have reviewed the patient's chart and labs.  Questions were answered to the patient's satisfaction.     Kathryn Hurley  No change from EGD and colonoscopy per plan.The risks, benefits, limitations, imponderables and alternatives regarding both EGD and colonoscopy have been reviewed with the patient. Questions have been answered. All parties agreeable.

## 2016-01-08 NOTE — Discharge Instructions (Signed)
EGD °Discharge instructions °Please read the instructions outlined below and refer to this sheet in the next few weeks. These discharge instructions provide you with general information on caring for yourself after you leave the hospital. Your doctor may also give you specific instructions. While your treatment has been planned according to the most current medical practices available, unavoidable complications occasionally occur. If you have any problems or questions after discharge, please call your doctor. °ACTIVITY °· You may resume your regular activity but move at a slower pace for the next 24 hours.  °· Take frequent rest periods for the next 24 hours.  °· Walking will help expel (get rid of) the air and reduce the bloated feeling in your abdomen.  °· No driving for 24 hours (because of the anesthesia (medicine) used during the test).  °· You may shower.  °· Do not sign any important legal documents or operate any machinery for 24 hours (because of the anesthesia used during the test).  °NUTRITION °· Drink plenty of fluids.  °· You may resume your normal diet.  °· Begin with a light meal and progress to your normal diet.  °· Avoid alcoholic beverages for 24 hours or as instructed by your caregiver.  °MEDICATIONS °· You may resume your normal medications unless your caregiver tells you otherwise.  °WHAT YOU CAN EXPECT TODAY °· You may experience abdominal discomfort such as a feeling of fullness or “gas” pains.  °FOLLOW-UP °· Your doctor will discuss the results of your test with you.  °SEEK IMMEDIATE MEDICAL ATTENTION IF ANY OF THE FOLLOWING OCCUR: °· Excessive nausea (feeling sick to your stomach) and/or vomiting.  °· Severe abdominal pain and distention (swelling).  °· Trouble swallowing.  °· Temperature over 101° F (37.8º C).  °Rectal bleeding or vomiting of blood.  ° °Colonoscopy °Discharge Instructions ° °Read the instructions outlined below and refer to this sheet in the next few weeks. These discharge  instructions provide you with general information on caring for yourself after you leave the hospital. Your doctor may also give you specific instructions. While your treatment has been planned according to the most current medical practices available, unavoidable complications occasionally occur. If you have any problems or questions after discharge, call Dr. Anuoluwapo Mefferd at [phone]. °ACTIVITY °· You may resume your regular activity, but move at a slower pace for the next 24 hours.  °· Take frequent rest periods for the next 24 hours.  °· Walking will help get rid of the air and reduce the bloated feeling in your belly (abdomen).  °· No driving for 24 hours (because of the medicine (anesthesia) used during the test).   °· Do not sign any important legal documents or operate any machinery for 24 hours (because of the anesthesia used during the test).  °NUTRITION °· Drink plenty of fluids.  °· You may resume your normal diet as instructed by your doctor.  °· Begin with a light meal and progress to your normal diet. Heavy or fried foods are harder to digest and may make you feel sick to your stomach (nauseated).  °· Avoid alcoholic beverages for 24 hours or as instructed.  °MEDICATIONS °· You may resume your normal medications unless your doctor tells you otherwise.  °WHAT YOU CAN EXPECT TODAY °· Some feelings of bloating in the abdomen.  °· Passage of more gas than usual.  °· Spotting of blood in your stool or on the toilet paper.  °IF YOU HAD POLYPS REMOVED DURING THE COLONOSCOPY: °·   No aspirin products for 7 days or as instructed.  °· No alcohol for 7 days or as instructed.  °· Eat a soft diet for the next 24 hours.  °FINDING OUT THE RESULTS OF YOUR TEST °Not all test results are available during your visit. If your test results are not back during the visit, make an appointment with your caregiver to find out the results. Do not assume everything is normal if you have not heard from your caregiver or the medical  facility. It is important for you to follow up on all of your test results.  °SEEK IMMEDIATE MEDICAL ATTENTION IF: °· You have more than a spotting of blood in your stool.  °· Your belly is swollen (abdominal distention).  °· You are nauseated or vomiting.  °· You have a temperature over 101.  °· You have abdominal pain or discomfort that is severe or gets worse throughout the day.  ° ° °Further recommendations to follow pending review of pathology report ° ° °

## 2016-01-08 NOTE — Progress Notes (Signed)
Still waiting on patients family member to arrive to go over discharge instructions

## 2016-01-08 NOTE — Op Note (Signed)
[facility] [address] [place], [address]   ENDOSCOPY PROCEDURE REPORT  PATIENT: Kathryn, Hurley  MR#: [id number] BIRTHDATE: [birth year] , 39  yrs. old GENDER: female ENDOSCOPIST: R.  Roetta Sessions, MD FACP FACG REFERRED BY:  Meredith Mody, M.D. PROCEDURE DATE:  January 31, 2016 PROCEDURE:  EGD w/ biopsy INDICATIONS:  Dyspepsia. MEDICATIONS: Versed 6 mg IV and Demerol 150 mg IV in divided doses. Zofran 4 mg IV.  Xylocaine gel orally. ASA CLASS:      Class II  CONSENT: The risks, benefits, limitations, alternatives and imponderables have been discussed.  The potential for biopsy, esophogeal dilation, etc. have also been reviewed.  Questions have been answered.  All parties agreeable.  Please see the history and physical in the medical record for more information.  DESCRIPTION OF PROCEDURE: After the risks benefits and alternatives of the procedure were thoroughly explained, informed consent was obtained.  The [id number] ([id number]) endoscope was introduced through the mouth and advanced to the second portion of the duodenum , limited by Without limitations. The instrument was slowly withdrawn as the mucosa was fully examined. Estimated blood loss is zero unless otherwise noted in this procedure report.    Normal-appearing tubular esophagus.  Stomach empty.  2 cm hiatal hernia present.  Some nodularity and antral erosions present.  No ulcer or infiltrating process.  Patent pylorus.  Normal-appearing first and second portion of the duodenum.  Retroflexed views revealed a hiatal hernia.    biopsies of the abnormal gastric mucosa taken. The scope was then withdrawn from the patient and the procedure completed.  COMPLICATIONS: There were no immediate complications. EBL 50 mL ENDOSCOPIC IMPRESSION: Gastric erosions?"status post biopsy. Hiatal hernia.  RECOMMENDATIONS: Follow-up on pathology. See colonoscopy report.  REPEAT EXAM:  eSigned:  R. Roetta Sessions, MD Jerrel Ivory  [facility] 01/31/16 8:01 AM    CC:  CPT CODES: ICD CODES:  The ICD and CPT codes recommended by this software are interpretations from the data that the clinical staff has captured with the software.  The verification of the translation of this report to the ICD and CPT codes and modifiers is the sole responsibility of the health care institution and practicing physician where this report was generated.  [employer] will not be held responsible for the validity of the ICD and CPT codes included on this report.  AMA assumes no liability for data contained or not contained herein. CPT is a Publishing rights manager of the [employer].  PATIENT NAME:  Kathryn, Hurley MR#: [id number]

## 2016-01-08 NOTE — Op Note (Signed)
[facility] [address] [place], [address]   COLONOSCOPY PROCEDURE REPORT  PATIENT: Kathryn Hurley, Kathryn Hurley  MR#: [id number] BIRTHDATE: Apr 09, 1976 , 39  yrs. old GENDER: female ENDOSCOPIST: R.  Roetta Sessions, MD FACP [facility] REFERRED UU:[id number] Pleas Patricia, M.D. PROCEDURE DATE:  02-02-2016 PROCEDURE:   Colonoscopy, diagnostic INDICATIONS:Paper hematochezia. MEDICATIONS: Versed 10 mg IV and Demerol 175 mg IV in divided doses. Zofran 4 mg IV. ASA CLASS:       Class II  CONSENT: The risks, benefits, alternatives and imponderables including but not limited to bleeding, perforation as well as the possibility of a missed lesion have been reviewed.  The potential for biopsy, lesion removal, etc. have also been discussed. Questions have been answered.  All parties agreeable.  Please see the history and physical in the medical record for more information.  DESCRIPTION OF PROCEDURE:   After the risks benefits and alternatives of the procedure were thoroughly explained, informed consent was obtained.  The digital rectal exam revealed no abnormalities of the rectum.   The [id number] ([id number])  endoscope was introduced through the anus and advanced to the cecum, which was identified by both the appendix and ileocecal valve. No adverse events experienced.   The quality of the prep was adequate  The instrument was then slowly withdrawn as the colon was fully examined. Estimated blood loss is zero unless otherwise noted in this procedure report.      COLON FINDINGS: Normal-appearing rectal mucosa.  Normal-appearing colonic mucosa.  Retroflexion was performed. .  Withdrawal time=6 minutes 0 seconds.  The scope was withdrawn and the procedure completed. COMPLICATIONS: There were no immediate complications.  ENDOSCOPIC IMPRESSION: Normal colonoscopy  RECOMMENDATIONS: See EGD report  eSigned:  R. Roetta Sessions, MD Jerrel Ivory [facility] Feb 02, 2016 8:18 AM   cc:  CPT CODES: ICD CODES:  The  ICD and CPT codes recommended by this software are interpretations from the data that the clinical staff has captured with the software.  The verification of the translation of this report to the ICD and CPT codes and modifiers is the sole responsibility of the health care institution and practicing physician where this report was generated.  [employer] will not be held responsible for the validity of the ICD and CPT codes included on this report.  AMA assumes no liability for data contained or not contained herein. CPT is a Publishing rights manager of the [employer].

## 2016-01-11 DIAGNOSIS — K5730 Diverticulosis of large intestine without perforation or abscess without bleeding: Secondary | ICD-10-CM | POA: Insufficient documentation

## 2016-01-11 DIAGNOSIS — K449 Diaphragmatic hernia without obstruction or gangrene: Secondary | ICD-10-CM | POA: Insufficient documentation

## 2016-01-11 DIAGNOSIS — K3189 Other diseases of stomach and duodenum: Secondary | ICD-10-CM | POA: Insufficient documentation

## 2016-01-12 ENCOUNTER — Encounter: Payer: Self-pay | Admitting: Internal Medicine

## 2016-01-12 ENCOUNTER — Encounter (HOSPITAL_COMMUNITY): Payer: Self-pay | Admitting: Internal Medicine

## 2016-01-13 ENCOUNTER — Encounter: Payer: Self-pay | Admitting: Internal Medicine

## 2016-02-16 ENCOUNTER — Ambulatory Visit: Payer: BLUE CROSS/BLUE SHIELD | Admitting: Gastroenterology

## 2018-10-10 ENCOUNTER — Encounter: Payer: Self-pay | Admitting: Nurse Practitioner

## 2018-10-10 ENCOUNTER — Ambulatory Visit: Payer: Self-pay | Admitting: Nurse Practitioner

## 2018-10-10 VITALS — BP 115/77 | HR 90 | Temp 97.4°F | Ht 66.0 in | Wt 224.4 lb

## 2018-10-10 DIAGNOSIS — K5900 Constipation, unspecified: Secondary | ICD-10-CM

## 2018-10-10 DIAGNOSIS — G8929 Other chronic pain: Secondary | ICD-10-CM

## 2018-10-10 DIAGNOSIS — K6289 Other specified diseases of anus and rectum: Secondary | ICD-10-CM

## 2018-10-10 DIAGNOSIS — K219 Gastro-esophageal reflux disease without esophagitis: Secondary | ICD-10-CM

## 2018-10-10 DIAGNOSIS — K625 Hemorrhage of anus and rectum: Secondary | ICD-10-CM

## 2018-10-10 MED ORDER — PANTOPRAZOLE SODIUM 40 MG PO TBEC: 40.0000 mg | DELAYED_RELEASE_TABLET | Freq: Every day | ORAL | 3 refills | Status: DC

## 2018-10-10 NOTE — Assessment & Plan Note (Signed)
The patient describes intermittent, low-volume hematochezia again.  This is in the setting of constipation and what she feels to be internal hemorrhoids.  Her last episode was about a month ago.  Colonoscopy was done about 2 and half years ago and unremarkable.  We will set her up for banding visit for anoscopy and evaluation with banding if appropriate.

## 2018-10-10 NOTE — Assessment & Plan Note (Signed)
Difficult to determine if the patient is having true rectal pain or not.  She describes it as her "bottom, not her bottom bottom but her bottom."  She seems to have a history of hemorrhoids.  She had a colonoscopy 2-1/2 years ago which did not show very impressive internal hemorrhoids.  However, with constipation she could have developed these in the interim.  She was previously placed on apothecary cream with nitroglycerin.  Her symptoms are mostly burning and no sharp pains.  I will send in apothecary cream with lidocaine.  We will have her set up with a banding provider to do an anoscopy and internal hemorrhoid banding, if appropriate.

## 2018-10-10 NOTE — Assessment & Plan Note (Signed)
The patient describes esophageal burning, throat burning, upper abdominal discomfort.  She has a chronic history of GERD and is not on any PPI.  She is only taking Tums as needed.  I will restart her on Protonix 40 mg daily.  Return for follow-up 4 months.

## 2018-10-10 NOTE — Patient Instructions (Signed)
1. I sent Protonix 40 mg to your pharmacy.  Take this once a day, on an empty stomach to help your heartburn. 2. I am giving you samples of Linzess 72 mcg.  Take this once a day, on an empty stomach. 3. Call us in 1 week and let us know if the Linzess is helping you have softer bowel movements and empty more completely. 4. I sent in a prescription for hemorrhoid cream to help with your burning for now. 5. I will have you visit with Tobi Bastosnna to evaluate for internal hemorrhoids and consider banding at that time if it seems that it would help.  At [facility] we value your feedback. You may receive a survey about your visit today. Please share your experience as we strive to create trusting relationships with our patients to provide genuine, compassionate, quality care.  We appreciate your understanding and patience as we review any laboratory studies, imaging, and other diagnostic tests that are ordered as we care for you. Our office policy is 5 business days for review of these results, and any emergent or urgent results are addressed in a timely manner for your best interest. If you do not hear from our office in 1 week, please contact us.   We also encourage the use of MyChart, which contains your medical information for your review as well. If you are not enrolled in this feature, an access code is on this after visit summary for your convenience. Thank you for allowing us to be involved in your care.  It was great to see you today!  I hope you have a Happy Thanksgiving!!

## 2018-10-10 NOTE — Assessment & Plan Note (Signed)
The patient describes what seems to be constipation.  She does not have straining.  However, she does have hard stools and incomplete emptying.  She was previously on Linzess and seem to be doing well on this.  I will restart her on Linzess 72 mcg daily.  I will give samples and asked her to call us in 1 to 2 weeks and let us know if it is helping her have softer bowel movements.

## 2018-10-10 NOTE — Progress Notes (Signed)
Referring Provider: Ardyth ManPark, Chan M, MD Primary Care Physician:  Ardyth ManPark, Chan M, MD Primary GI:  Dr. Jena Gaussourk  Chief Complaint  Patient presents with  . Hemorrhoids    some bleeding 1 month ago; strains with bm  . Abdominal Pain    mid lower  . burning in chest    HPI:   Kathryn Hurley is a 42 y.o. female who presents for hemorrhoids, abdominal pain, chest burning.  The patient has not been seen by our office since 12/18/2015 for rectal bleeding and dyspepsia.  At that time noted history of rectal pain and rectal bleeding with colonoscopy originally arranged but had to cancel.  Her rectal pain was worse with defecation and noted intermittent low-volume hematochezia and occasional constipation.  She was started on Linzess and [facility] cream with nitro compounded.  Intermittent GERD with epigastric discomfort but without PPI.  Started Protonix at a previous visit with hold on EGD unless persistent symptoms.  At her last visit she stated that apothecary cream helped a lot, Protonix improved symptoms of dyspepsia but still present.  No nausea or vomiting.  Spicy foods worsens her symptoms.  Intermittent, vague left upper quadrant discomfort despite dietary changes.  Rare NSAIDs.  Trying to avoid triggers.  Recommended continue Protonix daily, start Linzess.  Also recommended colonoscopy and upper endoscopy.  Colonoscopy completed 01/08/2016 which found a normal colonoscopy.  EGD completed the same day found gastric erosions status post biopsy, hiatal hernia.  Surgical pathology found the biopsies to be chronic gastritis without H. pylori.  Recommended continue current medications.  The patient has not been seen by our office since.  Patient is a difficult historian. Today she states she's having worsening of her symptoms. Having hemorrhoids flaring. Described "burning" in her back (lower to mid back), occasional to minimal rectal burning but not in a while. Thinks her hemorrhoids are internal and  not external. The rectal cream didn't help her symptoms before. Tried OTC Preparation H which didn't help. Also with some hematochezia about a month ago. Having some constipation symptoms, stools sometimes hard, incomplete emptying. Occasionally some diarrhea associated with her constipation/hard stools. Will have diarrhea with coffee or spicy foods as well. Has esophageal and throat burning, LUQ pain. Denies chest pain, dyspnea, dizziness, lightheadedness, syncope, near syncope. Denies any other upper or lower GI symptoms.  Overall it seems she has has gluteal burning? Sometimes back pain.  Past Medical History:  Diagnosis Date  . Acid reflux   . Constipation     Past Surgical History:  Procedure Laterality Date  . BIOPSY N/A 01/08/2016   Procedure: BIOPSY;  Surgeon: Corbin Adeobert M Rourk, MD;  [facility];  Service: Endoscopy;  Laterality: N/A;  Antral erosions  . CHOLECYSTECTOMY    . COLONOSCOPY N/A 01/08/2016   RMR: normal  . ESOPHAGOGASTRODUODENOSCOPY N/A 01/08/2016   RMR: Gastric erosion s/p bx/HH    Current Outpatient Medications  Medication Sig Dispense Refill  . calcium carbonate (TUMS - DOSED IN MG ELEMENTAL CALCIUM) 500 MG chewable tablet Chew 3 tablets by mouth as needed for indigestion or heartburn.    . pantoprazole (PROTONIX) 40 MG tablet Take 1 tablet (40 mg total) by mouth daily. (Patient not taking: Reported on 10/10/2018) 90 tablet 3   No current facility-administered medications for this visit.     Allergies as of 10/10/2018  . (No Known Allergies)    Family History  Problem Relation Age of Onset  . Colon cancer Neg Hx  Social History   Socioeconomic History  . Marital status: Married    Spouse name: Not on file  . Number of children: Not on file  . Years of education: Not on file  . Highest education level: Not on file  Occupational History  . Not on file  Social Needs  . Financial resource strain: Not on file  . Food insecurity:    Worry:  Not on file    Inability: Not on file  . Transportation needs:    Medical: Not on file    Non-medical: Not on file  Tobacco Use  . Smoking status: Never Smoker  . Smokeless tobacco: Never Used  Substance and Sexual Activity  . Alcohol use: No    Alcohol/week: 0.0 standard drinks  . Drug use: No  . Sexual activity: Not on file  Lifestyle  . Physical activity:    Days per week: Not on file    Minutes per session: Not on file  . Stress: Not on file  Relationships  . Social connections:    Talks on phone: Not on file    Gets together: Not on file    Attends religious service: Not on file    Active member of club or organization: Not on file    Attends meetings of clubs or organizations: Not on file    Relationship status: Not on file  Other Topics Concern  . Not on file  Social History Narrative  . Not on file    Review of Systems: General: Negative for anorexia, weight loss, fever, chills, fatigue, weakness. ENT: Negative for hoarseness, difficulty swallowing , nasal congestion. CV: Negative for chest pain, angina, palpitations, dyspnea on exertion, peripheral edema.  Respiratory: Negative for dyspnea at rest, dyspnea on exertion, cough, sputum, wheezing.  GI: See history of present illness. Endo: Negative for unusual weight change.  Heme: Negative for bruising or bleeding.   Physical Exam: BP 115/77   Pulse 90   Temp (!) 97.4 F (36.3 C) (Oral)   Ht 5\' 6"  (1.676 m)   Wt 224 lb 6.4 oz (101.8 kg)   LMP 10/03/2018 (Approximate)   BMI 36.22 kg/m  General:   Alert and oriented. Pleasant and cooperative. Well-nourished and well-developed.  Eyes:  Without icterus, sclera clear and conjunctiva pink.  Ears:  Normal auditory acuity. Cardiovascular:  S1, S2 present without murmurs appreciated. Extremities without clubbing or edema. Respiratory:  Clear to auscultation bilaterally. No wheezes, rales, or rhonchi. No distress.  Gastrointestinal:  +BS, soft, non-tender and  non-distended. No HSM noted. No guarding or rebound. No masses appreciated.  Rectal:  Deferred  Musculoskalatal:  Symmetrical without gross deformities. Neurologic:  Alert and oriented x4;  grossly normal neurologically. Psych:  Alert and cooperative. Normal mood and affect. Heme/Lymph/Immune: No excessive bruising noted.    10/10/2018 1:50 PM   Disclaimer: This note was dictated with voice recognition software. Similar sounding words can inadvertently be transcribed and may not be corrected upon review.

## 2018-10-15 NOTE — Progress Notes (Signed)
cc'ed to pcp °

## 2018-11-21 ENCOUNTER — Encounter: Payer: Self-pay | Admitting: Internal Medicine

## 2018-12-10 ENCOUNTER — Encounter: Payer: Self-pay | Admitting: Gastroenterology

## 2018-12-10 ENCOUNTER — Ambulatory Visit (INDEPENDENT_AMBULATORY_CARE_PROVIDER_SITE_OTHER): Payer: Self-pay | Admitting: Gastroenterology

## 2018-12-10 VITALS — BP 119/73 | HR 80 | Temp 97.0°F | Ht 66.0 in | Wt 224.2 lb

## 2018-12-10 DIAGNOSIS — K641 Second degree hemorrhoids: Secondary | ICD-10-CM | POA: Insufficient documentation

## 2018-12-10 NOTE — Patient Instructions (Signed)
Avoid straining.  Take Benefiber 2 teaspoons twice daily  Limit toilet time to 2-3 minutes  Call me with any problems in the meantime.   I recommend using the cream you have and also lidocaine ointment from over-the-counter as needed.   Please schedule followup appointment in 2-3 weeks from now.  It was a pleasure to see you today. I strive to create trusting relationships with patients to provide genuine, compassionate, and quality care. I value your feedback. If you receive a survey regarding your visit,  I greatly appreciate you taking time to fill this out.   Gelene Mink, PhD, ANP-BC [facility]

## 2018-12-10 NOTE — Progress Notes (Signed)
  [facility] Banding Note:   43 year old female with rectal bleeding recently in setting of constipation, seen last in the office Nov 2019. Colonoscopy normal in 2017. Here today for possible banding. She describes sensation of pressure, intermittent rectal bleeding, itching, burning. Feels like something falls out after BMs but reduces spontaneously. She has not had to manually reduce anything. Occasional feeling of spasms not associated with BMs. No recent rectal exam.   I initially examined patient first before determining candidacy for banding. No external abnormalities. She had no discomfort or pain on digital rectal exam. There was no mass. Anoscopy then performed with patient in the left lateral position with Grade 2 hemorrhoids. Appears right posterior column more pronounced with some friability. She does note sensation of right-sided prolapsing when questioned. No mass.   The patient presents with symptomatic grade 2 hemorrhoids, unresponsive to maximal medical therapy, requesting rubber band ligation of her hemorrhoidal disease. All risks, benefits, and alternative forms of therapy were described and informed consent was obtained.  The decision was made to band the  RIGHT POSTERIOR internal hemorrhoid, and the [facility] was used to perform band ligation without complication. Digital anorectal examination was then performed to assure proper positioning of the band, and no adjusting of the band was needed. The patient was discharged home without pain or other issues. Dietary and behavioral recommendations were given, and she was advised to continue anusol cream and lidocaine prn as well. The patient will return in early March for followup and possible additional banding as required.  No complications were encountered and the patient tolerated the procedure well.  Gelene MinkAnna W. Haron Beilke, PhD, ANP-BC [facility]

## 2019-01-15 NOTE — Progress Notes (Signed)
[facility] Banding Note:    43 year old female with history of rectal bleeding in setting of constipation, normal colonoscopy in 2017, with symptomatic Grade 1-2 hemorrhoids, s/p initial banding of right posterior internal hemorrhoid Jan 2020.    The patient presents with symptomatic grade 1 hemorrhoids, unresponsive to maximal medical therapy, requesting rubber band ligation of his/her hemorrhoidal disease. She has noted improvement from initial banding. Notes more pressure on the left side of rectum. All risks, benefits, and alternative forms of therapy were described and informed consent was obtained.  The decision was made to band the left lateral internal hemorrhoid, and the [facility] System was used to perform band ligation without complication. Digital anorectal examination was then performed to assure proper positioning of the band, and no adjustment was required. The patient was discharged home without pain or other issues. Dietary and behavioral recommendations were given along with follow-up instructions. The patient will return in 2-3 weeks  for followup and possible additional banding as required.  No complications were encountered and the patient tolerated the procedure well.  Gelene Mink, PhD, ANP-BC [facility]

## 2019-01-16 ENCOUNTER — Ambulatory Visit (INDEPENDENT_AMBULATORY_CARE_PROVIDER_SITE_OTHER): Payer: Self-pay | Admitting: Gastroenterology

## 2019-01-16 ENCOUNTER — Encounter: Payer: Self-pay | Admitting: Gastroenterology

## 2019-01-16 VITALS — BP 124/86 | HR 102 | Temp 97.3°F | Ht 66.0 in | Wt 222.8 lb

## 2019-01-16 DIAGNOSIS — K640 First degree hemorrhoids: Secondary | ICD-10-CM | POA: Insufficient documentation

## 2019-01-16 NOTE — Patient Instructions (Signed)
Avoid straining.  Take Benefiber 2 teaspoons twice daily  Limit toilet time to 2-3 minutes  Call me with any interim problems!  We will see you in 2-3 weeks from now!  I enjoyed seeing you again today! As you know, I value our relationship and want to provide genuine, compassionate, and quality care. I welcome your feedback. If you receive a survey regarding your visit,  I greatly appreciate you taking time to fill this out. See you next time!  Gelene Mink, PhD, ANP-BC [facility]

## 2019-01-17 ENCOUNTER — Encounter: Payer: Self-pay | Admitting: Internal Medicine

## 2019-01-30 ENCOUNTER — Encounter: Payer: Self-pay | Admitting: Gastroenterology

## 2019-01-30 ENCOUNTER — Encounter: Payer: Self-pay | Admitting: Internal Medicine

## 2019-02-06 ENCOUNTER — Ambulatory Visit: Payer: Self-pay | Admitting: Gastroenterology

## 2019-04-29 ENCOUNTER — Encounter: Payer: Self-pay | Admitting: Gastroenterology

## 2019-04-29 ENCOUNTER — Ambulatory Visit (INDEPENDENT_AMBULATORY_CARE_PROVIDER_SITE_OTHER): Payer: Self-pay | Admitting: Gastroenterology

## 2019-04-29 ENCOUNTER — Other Ambulatory Visit: Payer: Self-pay

## 2019-04-29 VITALS — BP 110/77 | HR 80 | Temp 97.4°F | Ht 66.0 in | Wt 225.2 lb

## 2019-04-29 DIAGNOSIS — R1013 Epigastric pain: Secondary | ICD-10-CM

## 2019-04-29 DIAGNOSIS — K641 Second degree hemorrhoids: Secondary | ICD-10-CM

## 2019-04-29 NOTE — Progress Notes (Signed)
[place] Banding Note:  43 year old very pleasant female with rectal bleeding in setting of constipation, normal colonoscopy in 2017, symptomatic Grade 1-2 hemorrhoids s/p banding of right posterior and left lateral earlier this year. Here to discuss right anterior banding. She also notes epigastric discomfort radiating to her back, associated with acid coming up in her throat. Has awakened her at night. No dysphagia. Has not been on Protonix for several months, because she felt this did not help. EGD on file from several years ago. Rare NSAIDs. No aspirin powders. Weight is stable. Drinks a lot of sun drop. She has noted improvement overall with hemorrhoid banding. Prunes have helped constipation. Still with itching, pressure, burning at times but overall much improved.   The patient presents with symptomatic grade 1-2 hemorrhoids, unresponsive to maximal medical therapy, requesting rubber band ligation of his/her hemorrhoidal disease. All risks, benefits, and alternative forms of therapy were described and informed consent was obtained.  The decision was made to band the right anterior internal hemorrhoid, and the [place] was used to perform band ligation without complication. Digital anorectal examination was then performed to assure proper positioning of the band, and to adjust the banded tissue as required. The patient was discharged home without pain or other issues. Dietary and behavioral recommendations were given and , along with follow-up instructions. The patient will return in 2 months for routine followup. She may benefit from one more additional banding in the future.   No complications were encountered and the patient tolerated the procedure well.  In regards to GERD/dyspepsia: start Dexilant samples once daily. Call with update. Patient assistance forms also provided. Consider EGD if persistent despite PPI. Follow strict GERD diet. Handout provided.   Annitta Needs, PhD,  ANP-BC [facility]

## 2019-04-29 NOTE — Patient Instructions (Signed)
Continue to avoid straining.  I have given you samples of Dexilant to take once each morning. Let me know how this works for you! We will apply for patient assistance if this works well.  We will see you in 2 months!  Follow the reflux diet sheet in order to maximize improvement of your symptoms.  I enjoyed seeing you again today! As you know, I value our relationship and want to provide genuine, compassionate, and quality care. I welcome your feedback. If you receive a survey regarding your visit,  I greatly appreciate you taking time to fill this out. See you next time!  Annitta Needs, PhD, ANP-BC [facility]

## 2019-07-05 ENCOUNTER — Encounter: Payer: Self-pay | Admitting: Gastroenterology

## 2019-07-05 ENCOUNTER — Ambulatory Visit (INDEPENDENT_AMBULATORY_CARE_PROVIDER_SITE_OTHER): Payer: Self-pay | Admitting: Gastroenterology

## 2019-07-05 ENCOUNTER — Other Ambulatory Visit: Payer: Self-pay

## 2019-07-05 VITALS — BP 110/77 | HR 69 | Temp 97.4°F | Ht 66.0 in | Wt 211.6 lb

## 2019-07-05 DIAGNOSIS — K219 Gastro-esophageal reflux disease without esophagitis: Secondary | ICD-10-CM

## 2019-07-05 DIAGNOSIS — K6289 Other specified diseases of anus and rectum: Secondary | ICD-10-CM | POA: Insufficient documentation

## 2019-07-05 DIAGNOSIS — R109 Unspecified abdominal pain: Secondary | ICD-10-CM

## 2019-07-05 MED ORDER — DICYCLOMINE HCL 10 MG PO CAPS: 10.0000 mg | ORAL_CAPSULE | Freq: Three times a day (TID) | ORAL | 3 refills | Status: DC

## 2019-07-05 NOTE — Patient Instructions (Addendum)
As Dexilant worked the best, we will try to get patient assistance for you. In the meantime, start Nexium samples one each morning, 30 minutes before breakfast. Let me know how you are doing next week!  For abdominal cramping, take Bentyl 30 minutes before meals as needed, up to 4 times a day. This can cause constipation, so monitor for that and hold if you have constipation.   I sent in the cream to [facility]. Use this as needed twice a day. Wear gloves and wash hands thereafter.  If you do not start feeling better, we will need to pursue further evaluation. I would like to see you in 4-6 weeks. Please keep me updated with how you are doing!  I enjoyed seeing you again today! As you know, I value our relationship and want to provide genuine, compassionate, and quality care. I welcome your feedback. If you receive a survey regarding your visit,  I greatly appreciate you taking time to fill this out. See you next time!  Annitta Needs, PhD, ANP-BC [facility]   Abdominal Bloating When you have abdominal bloating, your abdomen may feel full, tight, or painful. It may also look bigger than normal or swollen (distended). Common causes of abdominal bloating include:  Swallowing air.  Constipation.  Problems digesting food.  Eating too much.  Irritable bowel syndrome. This is a condition that affects the large intestine.  Lactose intolerance. This is an inability to digest lactose, a natural sugar in dairy products.  Celiac disease. This is a condition that affects the ability to digest gluten, a protein found in some grains.  Gastroparesis. This is a condition that slows down the movement of food in the stomach and small intestine. It is more common in people with diabetes mellitus.  Gastroesophageal reflux disease (GERD). This is a digestive condition that makes stomach acid flow back into the esophagus.  Urinary retention. This means that the body is holding  onto urine, and the bladder cannot be emptied all the way. Follow these instructions at home: Eating and drinking  Avoid eating too much.  Try not to swallow air while talking or eating.  Avoid eating while lying down.  Avoid these foods and drinks: ? Foods that cause gas, such as broccoli, cabbage, cauliflower, and baked beans. ? Carbonated drinks. ? Hard candy. ? Chewing gum. Medicines  Take over-the-counter and prescription medicines only as told by your health care provider.  Take probiotic medicines. These medicines contain live bacteria or yeasts that can help digestion.  Take coated peppermint oil capsules. Activity  Try to exercise regularly. Exercise may help to relieve bloating that is caused by gas and relieve constipation. General instructions  Keep all follow-up visits as told by your health care provider. This is important. Contact a health care provider if:  You have nausea and vomiting.  You have diarrhea.  You have abdominal pain.  You have unusual weight loss or weight gain.  You have severe pain, and medicines do not help. Get help right away if:  You have severe chest pain.  You have trouble breathing.  You have shortness of breath.  You have trouble urinating.  You have darker urine than normal.  You have blood in your stools or have dark, tarry stools. Summary  Abdominal bloating means that the abdomen is swollen.  Common causes of abdominal bloating are swallowing air, constipation, and problems digesting food.  Avoid eating too much and avoid swallowing air.  Avoid foods that cause  gas, carbonated drinks, hard candy, and chewing gum. This information is not intended to replace advice given to you by your health care provider. Make sure you discuss any questions you have with your health care provider. Document Released: 12/02/2016 Document Revised: 02/18/2019 Document Reviewed: 12/02/2016 Elsevier Patient Education  2020 [employer]

## 2019-07-05 NOTE — Progress Notes (Signed)
Referring Provider: Ranae Palms, MD Primary Care Physician:  Ranae Palms, MD Primary GI: Dr. Gala Romney   Chief Complaint  Patient presents with  . Hemorrhoids    does not feel like the banding help. Feels worse. C/o burning and swelling in the rectum area    HPI:   Kathryn Hurley is a 43 y.o. female presenting today with a history of symptomatic hemorrhoids, s/p banding X 3 earlier this year. Chronic GERD. Started on Dexilant samples in June 2020. Last colonoscopy Feb 2017 normal.   States rectal burning if eating spicy foods, greasy. Gets lower abdominal cramps. Lots of gas. No rectal bleeding. Sometimes knife-like pain/tear with BM. Rectal pressure and lower abdominal pressure. Rarely constipated. Eating prunes. No straining. Avoiding dairy, spicy foods, etc. Weight down 14 lbs from last visit. No rectal burning if eating chicken, salad. If adds cheese, will have rectal discomfort. Feels abdomen is bloated. Stool patterns alternate between well-formed, loose stool, pieces. Doesn't know what will trigger it. Has back pain between scapula. Will have cramps, pass gas, and feel better.   Dexilant worked well for GERD, dyspepsia. Back pain went away.   Difficult to describe her symptoms.    Past Medical History:  Diagnosis Date  . Acid reflux   . Constipation     Past Surgical History:  Procedure Laterality Date  . BIOPSY N/A 01/08/2016   Procedure: BIOPSY;  Surgeon: Daneil Dolin, MD;  [facility];  Service: Endoscopy;  Laterality: N/A;  Antral erosions  . CHOLECYSTECTOMY    . COLONOSCOPY N/A 01/08/2016   RMR: normal  . ESOPHAGOGASTRODUODENOSCOPY N/A 01/08/2016   RMR: Gastric erosion s/p biopsy with negative H.pylori.     Current Outpatient Medications  Medication Sig Dispense Refill  . calcium carbonate (TUMS - DOSED IN MG ELEMENTAL CALCIUM) 500 MG chewable tablet Chew 3 tablets by mouth as needed for indigestion or heartburn.    . dicyclomine (BENTYL) 10 MG  capsule Take 1 capsule (10 mg total) by mouth 4 (four) times daily -  before meals and at bedtime. As needed for abdominal cramping 120 capsule 3   No current facility-administered medications for this visit.     Allergies as of 07/05/2019  . (No Known Allergies)    Family History  Problem Relation Age of Onset  . Colon cancer Neg Hx     Social History   Socioeconomic History  . Marital status: Married    Spouse name: Not on file  . Number of children: Not on file  . Years of education: Not on file  . Highest education level: Not on file  Occupational History  . Not on file  Social Needs  . Financial resource strain: Not on file  . Food insecurity    Worry: Not on file    Inability: Not on file  . Transportation needs    Medical: Not on file    Non-medical: Not on file  Tobacco Use  . Smoking status: Never Smoker  . Smokeless tobacco: Never Used  Substance and Sexual Activity  . Alcohol use: No    Alcohol/week: 0.0 standard drinks  . Drug use: No  . Sexual activity: Not on file  Lifestyle  . Physical activity    Days per week: Not on file    Minutes per session: Not on file  . Stress: Not on file  Relationships  . Social connections    Talks on phone: Not on file  Gets together: Not on file    Attends religious service: Not on file    Active member of club or organization: Not on file    Attends meetings of clubs or organizations: Not on file    Relationship status: Not on file  Other Topics Concern  . Not on file  Social History Narrative  . Not on file    Review of Systems: As mentioned in HPI  Physical Exam: BP 110/77   Pulse 69   Temp (!) 97.4 F (36.3 C) (Oral)   Ht 5\' 6"  (1.676 m)   Wt 211 lb 9.6 oz (96 kg)   LMP 06/16/2019   BMI 34.15 kg/m  General:   Alert and oriented. No distress noted. Pleasant and cooperative.  Head:  Normocephalic and atraumatic. Eyes:  Conjuctiva clear without scleral icterus. Abdomen:  +BS, soft, mild TTP  lower abdomen and non-distended. No rebound or guarding. No HSM or masses noted. Rectal: no obvious fissure. Anoscopy without mass.  Msk:  Symmetrical without gross deformities. Normal posture. Extremities:  Without edema. Neurologic:  Alert and  oriented x4 Psych:  Alert and cooperative. Normal mood and affect.

## 2019-07-10 NOTE — Assessment & Plan Note (Signed)
Query IBS. Start Bentyl before meals prn. Return in follow up 4-6 weeks. If persistent bloating, lower abdominal discomfort despite supportive measures and avoidance of triggers, consider imaging.

## 2019-07-10 NOTE — Assessment & Plan Note (Signed)
Dexilant has worked best historically but will need patient assistance. In interim, I have provided Nexium samples to take. We will provide forms for patient assistance. 4-6 week return.

## 2019-07-10 NOTE — Assessment & Plan Note (Addendum)
Previously banded X 3 earlier this year due to symptomatic hemorrhoids. Difficult historian: reporting rectal burning if eating spicy foods, greasy foods, so she has changed her diet and actually lost weight. Rectal pressure reported with cramping at times after eating. Anoscopy without concerning findings. No obvious fissure. With painful BMs at times, will treat for occult fissure. [facility] cream with nitro provided. As she is a difficult historian, I do wonder if she is trying to describe what could be proctalgia fugax. May need updated colonoscopy. Return in 4-6 weeks for close follow-up.

## 2019-08-15 ENCOUNTER — Ambulatory Visit: Payer: Self-pay | Admitting: Gastroenterology

## 2019-08-22 ENCOUNTER — Ambulatory Visit: Payer: Self-pay | Admitting: Gastroenterology

## 2019-09-10 ENCOUNTER — Ambulatory Visit: Payer: Self-pay | Admitting: Gastroenterology

## 2019-09-13 ENCOUNTER — Other Ambulatory Visit: Payer: Self-pay

## 2019-09-13 ENCOUNTER — Encounter: Payer: Self-pay | Admitting: Gastroenterology

## 2019-09-13 ENCOUNTER — Ambulatory Visit (INDEPENDENT_AMBULATORY_CARE_PROVIDER_SITE_OTHER): Payer: Self-pay | Admitting: Gastroenterology

## 2019-09-13 VITALS — BP 111/78 | HR 74 | Temp 97.0°F | Ht 66.0 in | Wt 210.2 lb

## 2019-09-13 DIAGNOSIS — K625 Hemorrhage of anus and rectum: Secondary | ICD-10-CM

## 2019-09-13 DIAGNOSIS — K6289 Other specified diseases of anus and rectum: Secondary | ICD-10-CM

## 2019-09-13 DIAGNOSIS — R1030 Lower abdominal pain, unspecified: Secondary | ICD-10-CM

## 2019-09-13 DIAGNOSIS — G8929 Other chronic pain: Secondary | ICD-10-CM

## 2019-09-13 NOTE — Patient Instructions (Signed)
We are arranging a CT scan to further evaluate symptoms.  We are also arranging a flexible sigmoidoscopy in the near future to directly visualize your rectum/sigmoid better than we can do here in the office.  Further recommendations to follow!  I enjoyed seeing you again today! As you know, I value our relationship and want to provide genuine, compassionate, and quality care. I welcome your feedback. If you receive a survey regarding your visit,  I greatly appreciate you taking time to fill this out. See you next time!  Annitta Needs, PhD, ANP-BC [facility]

## 2019-09-13 NOTE — Progress Notes (Signed)
Referring Provider: Ardyth Man, MD Primary Care Physician:  Ardyth Man, MD Primary GI: Dr. Jena Gauss   Chief Complaint  Patient presents with  . Hemorrhoids    worse x 3-4 days with RB, discomfort    HPI:   Kathryn Hurley is a 43 y.o. female presenting today with a history of symptomatic hemorrhoids, s/p banding X 3 earlier this year. Chronic GERD. Started on Dexilant samples in June 2020. Last colonoscopy Feb 2017 normal. Previously seen in Aug 2020. At that time reporting rectal burning, pressure, anoscopy without concerning findings or obvious fissure. Treated empirically for fissure with [place] [facility] cream. Difficult historian, query proctalgia fugax. Bentyl for cramping, frequent stool.    Left buttock pain. Sometimes radiates down leg a bit but mostly in buttock. Lower belly intermittent discomfort is not bad. Worsened when needing to have a BM. For last 2-3 days diarrhea. Trying to eat fruit, fiber. Painful in buttocks with having a BM. Intermittent tissue hematochezia.   Chronic symptoms even prior to colonoscopy in 2017  but worsening. With banding earlier this year, she had resolution of symptoms briefly but then recurred. Feels symptoms are worsening. Unable to drink caffeine or spicy items, as she states about 30 minutes later will have rectal pain that radiates to left buttock. Feels like the pain is just above where "the banding was done". Feels her lower abdomen is inflamed.   Review of chart dating back to 2016 with symptoms. 2013 CT abd/pelvis with contrast without any concerning findings; this was done for similar symptoms.    Past Medical History:  Diagnosis Date  . Acid reflux   . Constipation     Past Surgical History:  Procedure Laterality Date  . BIOPSY N/A 01/08/2016   Procedure: BIOPSY;  Surgeon: Corbin Ade, MD;  Location: [facility];  Service: Endoscopy;  Laterality: N/A;  Antral erosions  . CHOLECYSTECTOMY    . COLONOSCOPY N/A 01/08/2016   RMR: normal  . ESOPHAGOGASTRODUODENOSCOPY N/A 01/08/2016   RMR: Gastric erosion s/p biopsy with negative H.pylori.     Current Outpatient Medications  Medication Sig Dispense Refill  . calcium carbonate (TUMS - DOSED IN MG ELEMENTAL CALCIUM) 500 MG chewable tablet Chew 3 tablets by mouth as needed for indigestion or heartburn.    . dicyclomine (BENTYL) 10 MG capsule Take 1 capsule (10 mg total) by mouth 4 (four) times daily -  before meals and at bedtime. As needed for abdominal cramping 120 capsule 3   No current facility-administered medications for this visit.     Allergies as of 09/13/2019  . (No Known Allergies)    Family History  Problem Relation Age of Onset  . Colon cancer Neg Hx     Social History   Socioeconomic History  . Marital status: Married    Spouse name: Not on file  . Number of children: Not on file  . Years of education: Not on file  . Highest education level: Not on file  Occupational History  . Not on file  Social Needs  . Financial resource strain: Not on file  . Food insecurity    Worry: Not on file    Inability: Not on file  . Transportation needs    Medical: Not on file    Non-medical: Not on file  Tobacco Use  . Smoking status: Never Smoker  . Smokeless tobacco: Never Used  Substance and Sexual Activity  . Alcohol use: No    Alcohol/week: 0.0 standard  drinks  . Drug use: No  . Sexual activity: Not on file  Lifestyle  . Physical activity    Days per week: Not on file    Minutes per session: Not on file  . Stress: Not on file  Relationships  . Social Herbalist on phone: Not on file    Gets together: Not on file    Attends religious service: Not on file    Active member of club or organization: Not on file    Attends meetings of clubs or organizations: Not on file    Relationship status: Not on file  Other Topics Concern  . Not on file  Social History Narrative  . Not on file    Review of Systems: Gen: Denies fever,  chills, anorexia. Denies fatigue, weakness, weight loss.  CV: Denies chest pain, palpitations, syncope, peripheral edema, and claudication. Resp: Denies dyspnea at rest, cough, wheezing, coughing up blood, and pleurisy. GI: see HPI Derm: Denies rash, itching, dry skin Psych: Denies depression, anxiety, memory loss, confusion. No homicidal or suicidal ideation.  Heme: Denies bruising, bleeding, and enlarged lymph nodes.  Physical Exam: BP 111/78   Pulse 74   Temp (!) 97 F (36.1 C) (Oral)   Ht 5\' 6"  (1.676 m)   Wt 210 lb 3.2 oz (95.3 kg)   LMP 09/12/2019   BMI 33.93 kg/m  General:   Alert and oriented. No distress noted. Pleasant and cooperative.  Head:  Normocephalic and atraumatic. Eyes:  Conjuctiva clear without scleral icterus. Mouth:  Oral mucosa pink and moist. Good dentition. No lesions. Lungs: clear bilaterally Cardiac: S1 S2 present without murmurs  Abdomen:  +BS, soft, non-tender and non-distended. No rebound or guarding. No HSM or masses noted. Msk:  Symmetrical without gross deformities. Normal posture. Extremities:  Without edema. Neurologic:  Alert and  oriented x4 Psych:  Alert and cooperative. Normal mood and affect.  ASSESSMENT: 43 year old female with long-standing history of rectal pain, worsened if eating spicy foods interestingly, short-term relief with outpatient banding but then returned. Reviewing chart, she has had these symptoms since 2016, with CT at that time no acute findings. Her symptoms are interesting, as she notes rectal pain radiating to left buttock. Somewhat difficult historian. Continues to have low-volume rectal bleeding. Despite empiric treatment for anal fissure, she has had no improvement. Anoscopy in office without obvious abnormalities.  Will pursue CT abd/pelvis due to lower abdominal pain, rectal pain. Last colonoscopy in 2017 unrevealing. Will also pursue flex sig for direct visualization. Anticipate surgical referral thereafter.    PLAN:  1. CT abd/pelvis in next week 2. Proceed with flex sig with Dr. Gala Romney in near future: the risks, benefits, and alternatives have been discussed with the patient in detail. The patient states understanding and desires to proceed. 3. May need surgical referral 4. Further recommendations to follow   Annitta Needs, PhD, ANP-BC [facility]

## 2019-09-16 ENCOUNTER — Ambulatory Visit (HOSPITAL_COMMUNITY)
Admission: RE | Admit: 2019-09-16 | Discharge: 2019-09-16 | Disposition: A | Discharge status: Home / Self Care | Payer: Self-pay | Source: Ambulatory Visit | Attending: Gastroenterology | Admitting: Gastroenterology

## 2019-09-16 ENCOUNTER — Other Ambulatory Visit: Payer: Self-pay

## 2019-09-16 DIAGNOSIS — R1030 Lower abdominal pain, unspecified: Secondary | ICD-10-CM | POA: Insufficient documentation

## 2019-09-16 DIAGNOSIS — K6289 Other specified diseases of anus and rectum: Secondary | ICD-10-CM | POA: Insufficient documentation

## 2019-09-16 DIAGNOSIS — G8929 Other chronic pain: Secondary | ICD-10-CM | POA: Insufficient documentation

## 2019-09-16 MED ORDER — IOHEXOL 300 MG/ML  SOLN
100.0000 mL | Freq: Once | INTRAMUSCULAR | Status: AC | PRN
  Administered 2019-09-16: 100 mL via INTRAVENOUS

## 2019-09-16 NOTE — Progress Notes (Signed)
Please let patient know that CT is normal.

## 2019-09-17 NOTE — Progress Notes (Signed)
cc'ed to pcp °

## 2019-11-04 ENCOUNTER — Other Ambulatory Visit (HOSPITAL_COMMUNITY)
Admission: RE | Admit: 2019-11-04 | Discharge: 2019-11-04 | Disposition: A | Discharge status: Home / Self Care | Payer: Self-pay | Source: Ambulatory Visit | Attending: Internal Medicine | Admitting: Internal Medicine

## 2019-11-04 ENCOUNTER — Other Ambulatory Visit: Payer: Self-pay

## 2019-11-04 DIAGNOSIS — Z01812 Encounter for preprocedural laboratory examination: Secondary | ICD-10-CM | POA: Insufficient documentation

## 2019-11-04 DIAGNOSIS — Z20828 Contact with and (suspected) exposure to other viral communicable diseases: Secondary | ICD-10-CM | POA: Insufficient documentation

## 2019-11-04 LAB — SARS CORONAVIRUS 2 (TAT 6-24 HRS): SARS Coronavirus 2: NEGATIVE

## 2019-11-05 ENCOUNTER — Other Ambulatory Visit: Payer: Self-pay

## 2019-11-05 ENCOUNTER — Encounter (HOSPITAL_COMMUNITY)
Admission: RE | Disposition: A | Discharge status: Home / Self Care | Payer: Self-pay | Source: Home / Self Care | Attending: Internal Medicine

## 2019-11-05 ENCOUNTER — Encounter (HOSPITAL_COMMUNITY): Payer: Self-pay | Admitting: Internal Medicine

## 2019-11-05 ENCOUNTER — Ambulatory Visit (HOSPITAL_COMMUNITY)
Admission: RE | Admit: 2019-11-05 | Discharge: 2019-11-05 | Disposition: A | Discharge status: Home / Self Care | Payer: Self-pay | Attending: Internal Medicine | Admitting: Internal Medicine

## 2019-11-05 ENCOUNTER — Telehealth: Payer: Self-pay

## 2019-11-05 DIAGNOSIS — K6289 Other specified diseases of anus and rectum: Secondary | ICD-10-CM | POA: Insufficient documentation

## 2019-11-05 DIAGNOSIS — K625 Hemorrhage of anus and rectum: Secondary | ICD-10-CM | POA: Insufficient documentation

## 2019-11-05 DIAGNOSIS — K602 Anal fissure, unspecified: Secondary | ICD-10-CM

## 2019-11-05 HISTORY — PX: FLEXIBLE SIGMOIDOSCOPY: SHX5431

## 2019-11-05 SURGERY — SIGMOIDOSCOPY, FLEXIBLE
Anesthesia: Moderate Sedation

## 2019-11-05 MED ORDER — LIDOCAINE HCL URETHRAL/MUCOSAL 2 % EX GEL
CUTANEOUS | Status: DC | PRN
  Administered 2019-11-05: 1 via TOPICAL

## 2019-11-05 MED ORDER — MEPERIDINE HCL 50 MG/ML IJ SOLN
INTRAMUSCULAR | Status: AC
  Filled 2019-11-05: qty 1

## 2019-11-05 MED ORDER — STERILE WATER FOR IRRIGATION IR SOLN
Status: DC | PRN
  Administered 2019-11-05: 1.5 mL

## 2019-11-05 MED ORDER — LIDOCAINE HCL URETHRAL/MUCOSAL 2 % EX GEL
CUTANEOUS | Status: AC
  Filled 2019-11-05: qty 30

## 2019-11-05 MED ORDER — MEPERIDINE HCL 100 MG/ML IJ SOLN
INTRAMUSCULAR | Status: DC | PRN
  Administered 2019-11-05: 25 mg via INTRAVENOUS
  Administered 2019-11-05: 15 mg via INTRAVENOUS

## 2019-11-05 MED ORDER — SODIUM CHLORIDE 0.9 % IV SOLN: INTRAVENOUS | Status: DC

## 2019-11-05 MED ORDER — ONDANSETRON HCL 4 MG/2ML IJ SOLN
INTRAMUSCULAR | Status: AC
  Filled 2019-11-05: qty 2

## 2019-11-05 MED ORDER — ONDANSETRON HCL 4 MG/2ML IJ SOLN
INTRAMUSCULAR | Status: DC | PRN
  Administered 2019-11-05: 4 mg via INTRAVENOUS

## 2019-11-05 MED ORDER — MIDAZOLAM HCL 5 MG/5ML IJ SOLN
INTRAMUSCULAR | Status: DC | PRN
  Administered 2019-11-05 (×2): 2 mg via INTRAVENOUS
  Administered 2019-11-05: 1 mg via INTRAVENOUS

## 2019-11-05 MED ORDER — MIDAZOLAM HCL 5 MG/5ML IJ SOLN
INTRAMUSCULAR | Status: AC
  Filled 2019-11-05: qty 10

## 2019-11-05 NOTE — Telephone Encounter (Signed)
Tammy at endo called office, pt needs surgery referral for probable anal fissure. Referral sent to general surgery via Epic.

## 2019-11-05 NOTE — Op Note (Signed)
[facility] Patient Name: Kathryn Hurley Procedure Date: 11/05/2019 11:37 AM MRN: [id number] Date of Birth: [birth year] Attending MD: Norvel Richards , MD CSN: [id number] Age: 43 Admit Type: Outpatient Procedure:                Flexible Sigmoidoscopy Indications:              Rectal hemorrhage, Rectal pain Providers:                Norvel Richards, MD, Otis Peak B. Gwenlyn Perking RN, RN,                            Randa Spike, Technician Referring MD:              Medicines:                Midazolam 5 mg IV, Meperidine 40 mg IV, Ondansetron                            4 mg IV Complications:            No immediate complications. Estimated Blood Loss:     Estimated blood loss: none. Procedure:                Pre-Anesthesia Assessment:                           - Prior to the procedure, a History and Physical                            was performed, and patient medications and                            allergies were reviewed. The patient's tolerance of                            previous anesthesia was also reviewed. The risks                            and benefits of the procedure and the sedation                            options and risks were discussed with the patient.                            All questions were answered, and informed consent                            was obtained. Prior Anticoagulants: The patient has                            taken no previous anticoagulant or antiplatelet                            agents. ASA Grade Assessment: II - A patient with  mild systemic disease. After reviewing the risks                            and benefits, the patient was deemed in                            satisfactory condition to undergo the procedure.                           After obtaining informed consent, the scope was                            passed under direct vision. The CF-[id number] ([id number]                            scope was  introduced through the anus and advanced                            to the the sigmoid colon. The flexible                            sigmoidoscopy was accomplished without difficulty.                            The patient tolerated the procedure well. The                            quality of the bowel preparation was adequate. Scope In: 1:28:24 PM Scope Out: 1:31:42 PM Total Procedure Duration: 0 hours 3 minutes 18 seconds  Findings:      The perianal and digital rectal examinations were normal. Scope advanced       in a one-to-one fashion to 40 cm. Distal rectal scar present from prior       hemorrhoid banding. Good retroflexion view of the anal verge and on?"face       view of the rectum as well as examination of the sigmoid colon to 40 cm       demonstrated no other abnormalities Impression:               -Normal sigmoidoscopy to 40 cm (rectal scar as                            described) tender DRE. I continue to suspect an                            occult anal fissure which is failed to improve with                            conservative measures. Moderate Sedation:      Moderate (conscious) sedation was personally administered by an       anesthesia professional. The following parameters were monitored: oxygen       saturation, heart rate, blood pressure, respiratory rate, EKG, adequacy       of pulmonary ventilation, and response to care. Recommendation:           -  Discharge patient to home. Consult general                            surgery (Dr. Lovell Sheehan) for further                            evaluation/management of probable occult anal                            fissure. And colonoscopy in 7 years. Procedure Code(s):        --- Professional ---                           [phone], Sigmoidoscopy, flexible; diagnostic,                            including collection of specimen(s) by brushing or                            washing, when performed (separate  procedure) Diagnosis Code(s):        --- Professional ---                           K62.89, Other specified diseases of anus and rectum                           K62.5, Hemorrhage of anus and rectum CPT copyright 2019 American Medical Association. All rights reserved. The codes documented in this report are preliminary and upon coder review may  be revised to meet current compliance requirements. Gerrit Friends. Ota Ebersole, MD Gennette Pac, MD 11/05/2019 3:04:12 PM This report has been signed electronically. Number of Addenda: 0

## 2019-11-05 NOTE — Discharge Instructions (Signed)
  Sigmoidoscopy Discharge Instructions  Read the instructions outlined below and refer to this sheet in the next few weeks. These discharge instructions provide you with general information on caring for yourself after you leave the hospital. Your doctor may also give you specific instructions. While your treatment has been planned according to the most current medical practices available, unavoidable complications occasionally occur. If you have any problems or questions after discharge, call Dr. Gala Romney at [phone]. ACTIVITY  You may resume your regular activity, but move at a slower pace for the next 24 hours.   Take frequent rest periods for the next 24 hours.   Walking will help get rid of the air and reduce the bloated feeling in your belly (abdomen).   No driving for 24 hours (because of the medicine (anesthesia) used during the test).    Do not sign any important legal documents or operate any machinery for 24 hours (because of the anesthesia used during the test).  NUTRITION  Drink plenty of fluids.   You may resume your normal diet as instructed by your doctor.   Begin with a light meal and progress to your normal diet. Heavy or fried foods are harder to digest and may make you feel sick to your stomach (nauseated).   Avoid alcoholic beverages for 24 hours or as instructed.  MEDICATIONS  You may resume your normal medications unless your doctor tells you otherwise.  WHAT YOU CAN EXPECT TODAY  Some feelings of bloating in the abdomen.   Passage of more gas than usual.   Spotting of blood in your stool or on the toilet paper.  IF YOU HAD POLYPS REMOVED DURING THE COLONOSCOPY:  No aspirin products for 7 days or as instructed.   No alcohol for 7 days or as instructed.   Eat a soft diet for the next 24 hours.  FINDING OUT THE RESULTS OF YOUR TEST Not all test results are available during your visit. If your test results are not back during the visit, make an appointment  with your caregiver to find out the results. Do not assume everything is normal if you have not heard from your caregiver or the medical facility. It is important for you to follow up on all of your test results.  SEEK IMMEDIATE MEDICAL ATTENTION IF:  You have more than a spotting of blood in your stool.   Your belly is swollen (abdominal distention).   You are nauseated or vomiting.   You have a temperature over 101.   You have abdominal pain or discomfort that is severe or gets worse throughout the day.        I suspect you have an anal fissure.  This is best treated by the general surgeon since you have failed medical treatment  Hemorrhoids are pretty much gone  Please make an appointment to see Dr. Aviva Signs reference probable symptomatic anal fissure  Return here for a screening colonoscopy in 7 years.  At patient's request, I called Lennette Bihari, at [phone] and reviewed findings and recommendations

## 2019-11-05 NOTE — H&P (Signed)
@LOGO @   Primary Care Physician:  Hampton Abbot, Barry Dienes, MD Primary Gastroenterologist:  Dr. Gala Romney  Pre-Procedure History & Physical: HPI:  Kathryn Hurley is a 43 y.o. female here for further evaluation of intermittent lancinating rectal pain and bleeding.  Status post multiple hemorrhoid banding sessions.  Negative colonoscopy 2017.  Flexible sigmoidoscopy now being done to rule out other coexisting processes.  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 Previous anoscopy negative.  Past Medical History:  Diagnosis Date  . Acid reflux   . Constipation     Past Surgical History:  Procedure Laterality Date  . BIOPSY N/A 01/08/2016   Procedure: BIOPSY;  Surgeon: Daneil Dolin, MD;  Location: [facility];  Service: Endoscopy;  Laterality: N/A;  Antral erosions  . CHOLECYSTECTOMY    . COLONOSCOPY N/A 01/08/2016   RMR: normal  . ESOPHAGOGASTRODUODENOSCOPY N/A 01/08/2016   RMR: Gastric erosion s/p biopsy with negative H.pylori.     Prior to Admission medications   Medication Sig Start Date End Date Taking? Authorizing Provider  calcium carbonate (TUMS - DOSED IN MG ELEMENTAL CALCIUM) 500 MG chewable tablet Chew 3 tablets by mouth as needed for indigestion or heartburn.    [provider]  dicyclomine (BENTYL) 10 MG capsule Take 1 capsule (10 mg total) by mouth 4 (four) times daily -  before meals and at bedtime. As needed for abdominal cramping 07/05/19   Annitta Needs, NP    Allergies as of 09/13/2019  . (No Known Allergies)    Family History  Problem Relation Age of Onset  . Colon cancer Neg Hx     Social History   Socioeconomic History  . Marital status: Married    Spouse name: Not on file  . Number of children: Not on file  . Years of education: Not on file  . Highest education level: Not on file  Occupational History  . Not on file  Tobacco Use  . Smoking status: Never Smoker  . Smokeless tobacco: Never Used  Substance  and Sexual Activity  . Alcohol use: No    Alcohol/week: 0.0 standard drinks  . Drug use: No  . Sexual activity: Not on file  Other Topics Concern  . Not on file  Social History Narrative  . Not on file   Social Determinants of Health   Financial Resource Strain:   . Difficulty of Paying Living Expenses: Not on file  Food Insecurity:   . Worried About Charity fundraiser in the Last Year: Not on file  . Ran Out of Food in the Last Year: Not on file  Transportation Needs:   . Lack of Transportation (Medical): Not on file  . Lack of Transportation (Non-Medical): Not on file  Physical Activity:   . Days of Exercise per Week: Not on file  . Minutes of Exercise per Session: Not on file  Stress:   . Feeling of Stress : Not on file  Social Connections:   . Frequency of Communication with Friends and Family: Not on file  . Frequency of Social Gatherings with Friends and Family: Not on file  . Attends Religious Services: Not on file  . Active Member of Clubs or Organizations: Not on file  . Attends Archivist Meetings: Not on file  . Marital Status: Not on file  Intimate Partner Violence:   . Fear of Current or Ex-Partner: Not on file  . Emotionally Abused: Not on  file  . Physically Abused: Not on file  . Sexually Abused: Not on file    Review of Systems: See HPI, otherwise negative ROS  Physical Exam: BP 120/70   Pulse 95   Temp 97.8 F (36.6 C) (Oral)   Resp 19   Ht 5\' 6"  (1.676 m)   Wt 93 kg   LMP 10/15/2019   SpO2 100%   BMI 33.09 kg/m  General:   Alert,  Well-developed, well-nourished, pleasant and cooperative in NAD Neck:  Supple; no masses or thyromegaly. No significant cervical adenopathy. Lungs:  Clear throughout to auscultation.   No wheezes, crackles, or rhonchi. No acute distress. Heart:  Regular rate and rhythm; no murmurs, clicks, rubs,  or gallops. Abdomen: Non-distended, normal bowel sounds.  Soft and nontender without appreciable mass or  hepatosplenomegaly.   Impression/Plan: 43 year old lady with intermittent rectal bleeding and rectal pain.  Sigmoidoscopy to further evaluate.  The risks, benefits, limitations, alternatives and imponderables have been reviewed with the patient. Questions have been answered. All parties are agreeable.      Notice: This dictation was prepared with Dragon dictation along with smaller phrase technology. Any transcriptional errors that result from this process are unintentional and may not be corrected upon review.

## 2019-11-24 ENCOUNTER — Other Ambulatory Visit: Payer: Self-pay

## 2019-11-24 ENCOUNTER — Emergency Department (HOSPITAL_COMMUNITY): Payer: Self-pay

## 2019-11-24 ENCOUNTER — Encounter (HOSPITAL_COMMUNITY): Payer: Self-pay

## 2019-11-24 ENCOUNTER — Emergency Department (HOSPITAL_COMMUNITY)
Admission: EM | Admit: 2019-11-24 | Discharge: 2019-11-24 | Disposition: A | Discharge status: Home / Self Care | Payer: Self-pay | Attending: Emergency Medicine | Admitting: Emergency Medicine

## 2019-11-24 DIAGNOSIS — Z79899 Other long term (current) drug therapy: Secondary | ICD-10-CM | POA: Insufficient documentation

## 2019-11-24 DIAGNOSIS — K219 Gastro-esophageal reflux disease without esophagitis: Secondary | ICD-10-CM | POA: Insufficient documentation

## 2019-11-24 DIAGNOSIS — U071 COVID-19: Secondary | ICD-10-CM | POA: Insufficient documentation

## 2019-11-24 LAB — RESPIRATORY PANEL BY RT PCR (FLU A&B, COVID)
Influenza A by PCR: NEGATIVE
Influenza B by PCR: NEGATIVE
SARS Coronavirus 2 by RT PCR: POSITIVE — AB

## 2019-11-24 LAB — POC SARS CORONAVIRUS 2 AG -  ED: SARS Coronavirus 2 Ag: NEGATIVE

## 2019-11-24 MED ORDER — FAMOTIDINE 20 MG PO TABS
20.0000 mg | ORAL_TABLET | Freq: Once | ORAL | Status: AC
  Administered 2019-11-24: 04:00:00 20 mg via ORAL
  Filled 2019-11-24: qty 1

## 2019-11-24 MED ORDER — ALUM & MAG HYDROXIDE-SIMETH 200-200-20 MG/5ML PO SUSP
30.0000 mL | Freq: Once | ORAL | Status: AC
  Administered 2019-11-24: 30 mL via ORAL
  Filled 2019-11-24: qty 30

## 2019-11-24 MED ORDER — PREDNISONE 20 MG PO TABS: ORAL_TABLET | ORAL | 0 refills | Status: DC

## 2019-11-24 MED ORDER — OMEPRAZOLE 20 MG PO CPDR: DELAYED_RELEASE_CAPSULE | ORAL | 0 refills | Status: DC

## 2019-11-24 MED ORDER — AZITHROMYCIN 250 MG PO TABS: ORAL_TABLET | ORAL | 0 refills | Status: DC

## 2019-11-24 NOTE — ED Triage Notes (Signed)
Pt presents to ED with covid symptoms, reports family member has covid, pt reports symptoms including sore throat, cough, diarrhea, loss of smell and chest pain that started one week ago-chest pain increased tonight

## 2019-11-24 NOTE — Discharge Instructions (Addendum)
Take the medications as prescribed.  Start taking zinc 50 mg once a day, vitamin C and vitamin D, and take a baby aspirin a day.  You can take acetaminophen for fever if needed.  Return to the emergency department if you are struggling to breathe or you feel like you are worse.  If you have access to a pulse ox monitor you should be rechecked if your oxygen drops below 90%.

## 2019-11-24 NOTE — ED Provider Notes (Signed)
[facility] EMERGENCY DEPARTMENT Provider Note   CSN: [id number] Arrival date & time: 11/24/19  0146   Time seen 2:19 AM    History Chief Complaint  Patient presents with  . covid symptoms    family + covid    Kathryn Hurley is a 44 y.o. female.  HPI patient states her father who lives with her was diagnosed as being positive for Covid on the 6.  She states she started having body aches on the first and then developed a dry cough.  She did have diarrhea but it is gone now.  She has not had nausea or vomiting.  She states she gets central chest pain when she coughs and it last a few minutes.  She also gets a burning sensation across her chest and sometimes gets burning fluid in her throat.  She complains of having a sore throat and a dry mouth but denies rhinorrhea.  She lost her sense of taste and smell on January 4.  She denies feeling short of breath.  She has been taking Mucinex and Sudafed since she got ill.  We discussed that the Sudafed is what is making her mouth feel dry.   PCP Park, Barry Dienes, MD   Past Medical History:  Diagnosis Date  . Acid reflux   . Constipation     Patient Active Problem List   Diagnosis Date Noted  . Lower abdominal pain 09/13/2019  . Abdominal cramping 07/05/2019  . Rectal pain 07/05/2019  . Grade I hemorrhoids 01/16/2019  . Grade II hemorrhoids 12/10/2018  . Constipation 10/10/2018  . Diverticulosis of colon without hemorrhage   . Mucosal abnormality of stomach   . Hiatal hernia   . Dyspepsia 12/18/2015  . GERD (gastroesophageal reflux disease) 10/21/2015  . Rectal bleeding 10/21/2015  . Rectal pain, chronic 10/21/2015    Past Surgical History:  Procedure Laterality Date  . BIOPSY N/A 01/08/2016   Procedure: BIOPSY;  Surgeon: Daneil Dolin, MD;  Location: [facility];  Service: Endoscopy;  Laterality: N/A;  Antral erosions  . CHOLECYSTECTOMY    . COLONOSCOPY N/A 01/08/2016   RMR: normal  . ESOPHAGOGASTRODUODENOSCOPY N/A 01/08/2016   RMR: Gastric erosion s/p biopsy with negative H.pylori.   Marland Kitchen FLEXIBLE SIGMOIDOSCOPY N/A 11/05/2019   Procedure: FLEXIBLE SIGMOIDOSCOPY;  Surgeon: Daneil Dolin, MD;  Location: [facility];  Service: Endoscopy;  Laterality: N/A;  1:45pm     OB History   No obstetric history on file.     Family History  Problem Relation Age of Onset  . Colon cancer Neg Hx     Social History   Tobacco Use  . Smoking status: Never Smoker  . Smokeless tobacco: Never Used  Substance Use Topics  . Alcohol use: No    Alcohol/week: 0.0 standard drinks  . Drug use: No  lives at home unemployed  Home Medications Prior to Admission medications   Medication Sig Start Date End Date Taking? Authorizing Provider  azithromycin (ZITHROMAX Z-PAK) 250 MG tablet Take 2 po the first day then once a day for the next 4 days. 11/24/19   Rolland Porter, MD  calcium carbonate (TUMS - DOSED IN MG ELEMENTAL CALCIUM) 500 MG chewable tablet Chew 3 tablets by mouth as needed for indigestion or heartburn.    [provider]  dicyclomine (BENTYL) 10 MG capsule Take 1 capsule (10 mg total) by mouth 4 (four) times daily -  before meals and at bedtime. As needed for abdominal cramping 07/05/19  Annitta Needs, NP  omeprazole (PRILOSEC) 20 MG capsule Take 1 po BID x 2 weeks then once a day 11/24/19   Rolland Porter, MD  predniSONE (DELTASONE) 20 MG tablet Take 3 po QD x 3d , then 2 po QD x 3d then 1 po QD x 3d 11/24/19   Rolland Porter, MD    Allergies    Patient has no known allergies.  Review of Systems   Review of Systems  All other systems reviewed and are negative.   Physical Exam ED Triage Vitals  Enc Vitals Group     BP 11/24/19 0230 90/69     Pulse Rate 11/24/19 0215 95     Resp 11/24/19 0215 19     Temp --      Temp src --      SpO2 11/24/19 0215 97 %     Weight 11/24/19 0158 210 lb (95.3 kg)     Height 11/24/19 0158 '5\' 6"'$  (1.676 m)     Head Circumference --      Peak Flow --      Pain Score 11/24/19 0158 8       Pain Loc --      Pain Edu? --      Excl. in [place]? --     Laboratory interpretation all normal except borderline blood pressure, normal pulse ox.   Physical Exam Vitals and nursing note reviewed.  Constitutional:      General: She is not in acute distress.    Appearance: Normal appearance. She is well-developed. She is not ill-appearing or toxic-appearing.  HENT:     Head: Normocephalic and atraumatic.     Right Ear: External ear normal.     Left Ear: External ear normal.     Nose: Nose normal. No mucosal edema or rhinorrhea.     Mouth/Throat:     Dentition: No dental abscesses.     Pharynx: No uvula swelling.  Eyes:     Conjunctiva/sclera: Conjunctivae normal.     Pupils: Pupils are equal, round, and reactive to light.  Cardiovascular:     Rate and Rhythm: Normal rate and regular rhythm.     Heart sounds: Normal heart sounds. No murmur. No friction rub. No gallop.   Pulmonary:     Effort: Pulmonary effort is normal. No respiratory distress.     Breath sounds: Normal breath sounds. No wheezing, rhonchi or rales.  Chest:     Chest wall: No tenderness or crepitus.  Abdominal:     General: Bowel sounds are normal. There is no distension.     Palpations: Abdomen is soft.     Tenderness: There is no abdominal tenderness. There is no guarding or rebound.  Musculoskeletal:        General: No tenderness. Normal range of motion.     Cervical back: Full passive range of motion without pain, normal range of motion and neck supple.     Right lower leg: No edema.     Left lower leg: No edema.     Comments: Moves all extremities well.   Skin:    General: Skin is warm and dry.     Coloration: Skin is not pale.     Findings: No erythema or rash.  Neurological:     General: No focal deficit present.     Mental Status: She is alert and oriented to person, place, and time.     Cranial Nerves: No cranial nerve deficit.  Psychiatric:  Mood and Affect: Mood normal. Mood is not  anxious.        Speech: Speech normal.        Behavior: Behavior normal.        Thought Content: Thought content normal.     ED Results / Procedures / Treatments   Labs (all labs ordered are listed, but only abnormal results are displayed) Results for orders placed or performed during the hospital encounter of 11/24/19  Respiratory Panel by RT PCR (Flu A&B, Covid) - Nasopharyngeal Swab   Specimen: Nasopharyngeal Swab  Result Value Ref Range   SARS Coronavirus 2 by RT PCR POSITIVE (A) NEGATIVE   Influenza A by PCR NEGATIVE NEGATIVE   Influenza B by PCR NEGATIVE NEGATIVE  POC SARS Coronavirus 2 Ag-ED - Nasal Swab (BD Veritor Kit)  Result Value Ref Range   SARS Coronavirus 2 Ag NEGATIVE NEGATIVE    Laboratory interpretation all normal except + Covid   EKG EKG Interpretation  Date/Time:  Sunday November 24 2019 02:02:47 EST Ventricular Rate:  95 PR Interval:    QRS Duration: 81 QT Interval:  347 QTC Calculation: 437 R Axis:   55 Text Interpretation: Sinus rhythm Atrial premature complex Low voltage, precordial leads Borderline T abnormalities, anterior leads No old tracing to compare Confirmed by Rolland Porter [phone]) on 11/24/2019 2:12:09 AM   Radiology DG Chest Port 1 View  Result Date: 11/24/2019 CLINICAL DATA:  Exposure to COVID, chest pain EXAM: PORTABLE CHEST 1 VIEW COMPARISON:  None. FINDINGS: The heart size and mediastinal contours are within normal limits. Both lungs are clear. The visualized skeletal structures are unremarkable. IMPRESSION: No active disease. Electronically Signed   By: Prudencio Pair M.D.   On: 11/24/2019 02:40    Procedures Procedures (including critical care time)  Medications Ordered in ED Medications  alum & mag hydroxide-simeth (MAALOX/MYLANTA) 200-200-20 MG/5ML suspension 30 mL (has no administration in time range)  famotidine (PEPCID) tablet 20 mg (has no administration in time range)    ED Course  I have reviewed the triage vital signs  and the nursing notes.  Pertinent labs & imaging results that were available during my care of the patient were reviewed by me and considered in my medical decision making (see chart for details).    MDM Rules/Calculators/A&P                      Patient is describing some reflux symptoms with getting burning fluid in her throat and having a burning sensation in her chest.  She was given Maalox and oral Pepcid.  Patient was informed of her positive Covid test.  She was started on a Z-Pak and prednisone.  She was advised to take zinc, vitamin D and C over-the-counter.  And to take a baby aspirin a day.  She should be rechecked if she is struggling to breathe or if her pulse ox drops below 90%.  Kathryn Hurley was evaluated in Emergency Department on 11/24/2019 for the symptoms described in the history of present illness. She was evaluated in the context of the global COVID-19 pandemic, which necessitated consideration that the patient might be at risk for infection with the SARS-CoV-2 virus that causes COVID-19. Institutional protocols and algorithms that pertain to the evaluation of patients at risk for COVID-19 are in a state of rapid change based on information released by regulatory bodies including the CDC and federal and state organizations. These policies and algorithms were followed during the patient's care in  the ED.    Final Clinical Impression(s) / ED Diagnoses Final diagnoses:  COVID-19  Gastroesophageal reflux disease without esophagitis    Rx / DC Orders ED Discharge Orders         Ordered    azithromycin (ZITHROMAX Z-PAK) 250 MG tablet     11/24/19 0344    predniSONE (DELTASONE) 20 MG tablet     11/24/19 0344    omeprazole (PRILOSEC) 20 MG capsule     11/24/19 0344          Plan discharge  Rolland Porter, MD, Barbette Or, MD 11/24/19 [phone]

## 2019-11-24 NOTE — ED Notes (Signed)
ED Provider at bedside. 

## 2019-11-25 ENCOUNTER — Other Ambulatory Visit: Payer: Self-pay

## 2019-11-25 DIAGNOSIS — U071 COVID-19: Secondary | ICD-10-CM | POA: Insufficient documentation

## 2019-11-25 DIAGNOSIS — J189 Pneumonia, unspecified organism: Secondary | ICD-10-CM | POA: Insufficient documentation

## 2019-11-26 ENCOUNTER — Other Ambulatory Visit: Payer: Self-pay

## 2019-11-26 ENCOUNTER — Encounter (HOSPITAL_COMMUNITY): Payer: Self-pay | Admitting: Emergency Medicine

## 2019-11-26 ENCOUNTER — Emergency Department (HOSPITAL_COMMUNITY): Payer: Self-pay

## 2019-11-26 ENCOUNTER — Emergency Department (HOSPITAL_COMMUNITY)
Admission: EM | Admit: 2019-11-26 | Discharge: 2019-11-26 | Disposition: A | Discharge status: Home / Self Care | Payer: Self-pay | Attending: Emergency Medicine | Admitting: Emergency Medicine

## 2019-11-26 DIAGNOSIS — R079 Chest pain, unspecified: Secondary | ICD-10-CM

## 2019-11-26 DIAGNOSIS — J1282 Pneumonia due to coronavirus disease 2019: Secondary | ICD-10-CM

## 2019-11-26 LAB — BASIC METABOLIC PANEL WITH GFR
Anion gap: 9 (ref 5–15)
BUN: 6 mg/dL (ref 6–20)
CO2: 24 mmol/L (ref 22–32)
Calcium: 8.9 mg/dL (ref 8.9–10.3)
Chloride: 103 mmol/L (ref 98–111)
Creatinine, Ser: 0.58 mg/dL (ref 0.44–1.00)
GFR calc Af Amer: >60 mL/min
GFR calc non Af Amer: >60 mL/min
Glucose, Bld: 108 mg/dL — ABNORMAL HIGH (ref 70–99)
Potassium: 3.7 mmol/L (ref 3.5–5.1)
Sodium: 136 mmol/L (ref 135–145)

## 2019-11-26 LAB — CBC WITH DIFFERENTIAL/PLATELET
Abs Immature Granulocytes: 0 K/uL (ref 0.00–0.07)
Basophils Absolute: 0 K/uL (ref 0.0–0.1)
Basophils Relative: 0 %
Eosinophils Absolute: 0 K/uL (ref 0.0–0.5)
Eosinophils Relative: 0 %
HCT: 41.6 % (ref 36.0–46.0)
Hemoglobin: 13.8 g/dL (ref 12.0–15.0)
Immature Granulocytes: 0 %
Lymphocytes Relative: 40 %
Lymphs Abs: 2 K/uL (ref 0.7–4.0)
MCH: 31.6 pg (ref 26.0–34.0)
MCHC: 33.2 g/dL (ref 30.0–36.0)
MCV: 95.2 fL (ref 80.0–100.0)
Monocytes Absolute: 0.4 K/uL (ref 0.1–1.0)
Monocytes Relative: 8 %
Neutro Abs: 2.6 K/uL (ref 1.7–7.7)
Neutrophils Relative %: 52 %
Platelets: 228 K/uL (ref 150–400)
RBC: 4.37 MIL/uL (ref 3.87–5.11)
RDW: 11.7 % (ref 11.5–15.5)
WBC: 5 K/uL (ref 4.0–10.5)
nRBC: 0 % (ref 0.0–0.2)

## 2019-11-26 LAB — HCG, QUANTITATIVE, PREGNANCY: hCG, Beta Chain, Quant, S: <1 m[IU]/mL

## 2019-11-26 LAB — D-DIMER, QUANTITATIVE: D-Dimer, Quant: 0.83 ug{FEU}/mL — ABNORMAL HIGH (ref 0.00–0.50)

## 2019-11-26 MED ORDER — TRAMADOL HCL 50 MG PO TABS: 50.0000 mg | ORAL_TABLET | Freq: Four times a day (QID) | ORAL | 0 refills | Status: DC | PRN

## 2019-11-26 MED ORDER — MORPHINE SULFATE (PF) 4 MG/ML IV SOLN
4.0000 mg | Freq: Once | INTRAVENOUS | Status: AC
  Administered 2019-11-26: 4 mg via INTRAVENOUS
  Filled 2019-11-26: qty 1

## 2019-11-26 MED ORDER — IOHEXOL 350 MG/ML SOLN
100.0000 mL | Freq: Once | INTRAVENOUS | Status: AC | PRN
  Administered 2019-11-26: 100 mL via INTRAVENOUS

## 2019-11-26 NOTE — Discharge Instructions (Signed)
Take acetaminophen as needed for pain. Take tramadol for more severe pain.  Return if you are having difficulty breathing.

## 2019-11-26 NOTE — ED Provider Notes (Signed)
[facility] EMERGENCY DEPARTMENT Provider Note   CSN: [id number] Arrival date & time: 11/25/19  2317    History Chief Complaint  Patient presents with   Chest Pain    Kathryn Hurley is a 44 y.o. female.  The history is provided by the patient.  Chest Pain She tested positive for COVID-19 on January 6, and was in the emergency department 2 days ago with complaints of chest pain.  She continues to have chest pain which she rates at 9/10.  Pain is worse with movement, deep breath, coughing.  She continues to cough productive of small amount of white sputum which is occasionally blood-streaked.  She denies fever chills or sweats.  She denies nausea, vomiting.  She does not feel that she is short of breath.  She has been taking acetaminophen, but it is not giving her relief from pain.  She is not able to sleep because of the soreness in her chest.  Past Medical History:  Diagnosis Date   Acid reflux    Constipation     Patient Active Problem List   Diagnosis Date Noted   Lower abdominal pain 09/13/2019   Abdominal cramping 07/05/2019   Rectal pain 07/05/2019   Grade I hemorrhoids 01/16/2019   Grade II hemorrhoids 12/10/2018   Constipation 10/10/2018   Diverticulosis of colon without hemorrhage    Mucosal abnormality of stomach    Hiatal hernia    Dyspepsia 12/18/2015   GERD (gastroesophageal reflux disease) 10/21/2015   Rectal bleeding 10/21/2015   Rectal pain, chronic 10/21/2015    Past Surgical History:  Procedure Laterality Date   BIOPSY N/A 01/08/2016   Procedure: BIOPSY;  Surgeon: Daneil Dolin, MD;  Location: [facility];  Service: Endoscopy;  Laterality: N/A;  Antral erosions   CHOLECYSTECTOMY     COLONOSCOPY N/A 01/08/2016   RMR: normal   ESOPHAGOGASTRODUODENOSCOPY N/A 01/08/2016   RMR: Gastric erosion s/p biopsy with negative H.pylori.    FLEXIBLE SIGMOIDOSCOPY N/A 11/05/2019   Procedure: FLEXIBLE SIGMOIDOSCOPY;  Surgeon: Daneil Dolin,  MD;  Location: [facility];  Service: Endoscopy;  Laterality: N/A;  1:45pm     OB History   No obstetric history on file.     Family History  Problem Relation Age of Onset   Colon cancer Neg Hx     Social History   Tobacco Use   Smoking status: Never Smoker   Smokeless tobacco: Never Used  Substance Use Topics   Alcohol use: No    Alcohol/week: 0.0 standard drinks   Drug use: No    Home Medications Prior to Admission medications   Medication Sig Start Date End Date Taking? Authorizing Provider  azithromycin (ZITHROMAX Z-PAK) 250 MG tablet Take 2 po the first day then once a day for the next 4 days. 11/24/19   Rolland Porter, MD  calcium carbonate (TUMS - DOSED IN MG ELEMENTAL CALCIUM) 500 MG chewable tablet Chew 3 tablets by mouth as needed for indigestion or heartburn.    [provider]  dicyclomine (BENTYL) 10 MG capsule Take 1 capsule (10 mg total) by mouth 4 (four) times daily -  before meals and at bedtime. As needed for abdominal cramping 07/05/19   Annitta Needs, NP  omeprazole (PRILOSEC) 20 MG capsule Take 1 po BID x 2 weeks then once a day 11/24/19   Rolland Porter, MD  predniSONE (DELTASONE) 20 MG tablet Take 3 po QD x 3d , then 2 po QD x 3d then 1 po  QD x 3d 11/24/19   Devoria Albe, MD    Allergies    Patient has no known allergies.  Review of Systems   Review of Systems  Cardiovascular: Positive for chest pain.  All other systems reviewed and are negative.   Physical Exam Updated Vital Signs BP 129/74    Pulse 99    Temp 97.8 F (36.6 C)    Resp 18    Ht 5\' 6"  (1.676 m)    Wt 95.3 kg    LMP 11/09/2019 (Exact Date)    SpO2 100%    BMI 33.91 kg/m   Physical Exam Vitals and nursing note reviewed.   44 year old female, resting comfortably and in no acute distress. Vital signs are normal. Oxygen saturation is 100%, which is normal. Head is normocephalic and atraumatic. PERRLA, EOMI. Oropharynx is clear. Neck is nontender and supple without adenopathy or  JVD. Back is nontender and there is no CVA tenderness. Lungs are clear without rales, wheezes, or rhonchi. Chest is moderately tender diffusely.  There is no crepitus. Heart has regular rate and rhythm without murmur. Abdomen is soft, flat, nontender without masses or hepatosplenomegaly and peristalsis is normoactive. Extremities have no cyanosis or edema, full range of motion is present. Skin is warm and dry without rash. Neurologic: Mental status is normal, cranial nerves are intact, there are no motor or sensory deficits.  ED Results / Procedures / Treatments   Labs (all labs ordered are listed, but only abnormal results are displayed) Labs Reviewed  D-DIMER, QUANTITATIVE (NOT AT [facility]) - Abnormal; Notable for the following components:      Result Value   D-Dimer, Quant 0.83 (*)    All other components within normal limits  BASIC METABOLIC PANEL - Abnormal; Notable for the following components:   Glucose, Bld 108 (*)    All other components within normal limits  CBC WITH DIFFERENTIAL/PLATELET  HCG, QUANTITATIVE, PREGNANCY    EKG EKG Interpretation  Date/Time:  Tuesday November 26 2019 00:47:25 EST Ventricular Rate:  93 PR Interval:    QRS Duration: 84 QT Interval:  348 QTC Calculation: 433 R Axis:   51 Text Interpretation: Sinus rhythm Low voltage, precordial leads Borderline T abnormalities, anterior leads Baseline wander in lead(s) II III aVF When compared with ECG of 11/24/2019, No significant change was found Confirmed by 01/22/2020 (Dione Booze) on 11/26/2019 1:06:11 AM   Radiology CT Angio Chest PE W and/or Wo Contrast  Result Date: 11/26/2019 CLINICAL DATA:  COVID positive shortness of breath EXAM: CT ANGIOGRAPHY CHEST WITH CONTRAST TECHNIQUE: Multidetector CT imaging of the chest was performed using the standard protocol during bolus administration of intravenous contrast. Multiplanar CT image reconstructions and MIPs were obtained to evaluate the vascular anatomy.  CONTRAST:  01/24/2020 OMNIPAQUE IOHEXOL 350 MG/ML SOLN COMPARISON:  Radiograph same day FINDINGS: Cardiovascular: There is a optimal opacification of the pulmonary arteries. There is no central,segmental, or subsegmental filling defects within the pulmonary arteries. The heart is normal in size. No pericardial effusion or thickening. No evidence right heart strain. There is normal three-vessel brachiocephalic anatomy without proximal stenosis. The thoracic aorta is normal in appearance. Mediastinum/Nodes: No hilar, mediastinal, or axillary adenopathy. Thyroid gland, trachea, and esophagus demonstrate no significant findings. Lungs/Pleura: Multifocal patchy airspace opacities are seen throughout both lungs, predominantly within the periphery of the lower lungs. No pleural effusion or pneumothorax is seen. No airspace consolidation. Upper Abdomen: No acute abnormalities present in the visualized portions of the upper abdomen. Surgical clips  seen within the gallbladder bed. Musculoskeletal: No chest wall abnormality. No acute or significant osseous findings. Review of the MIP images confirms the above findings. IMPRESSION: No central, segmental, or subsegmental pulmonary embolism. Multifocal patchy/ground-glass opacities throughout both lungs, predominantly within the lung bases, consistent with COVID pneumonia. Electronically Signed   By: Jonna Clark M.D.   On: 11/26/2019 03:48   DG Chest Port 1 View  Result Date: 11/26/2019 CLINICAL DATA:  44 year old female with chest pain. Positive COVID-19. EXAM: PORTABLE CHEST 1 VIEW COMPARISON:  Portable chest 11/24/2019. CT Abdomen and Pelvis 09/16/2019. FINDINGS: Portable AP upright view at 0200 hours. Lung volumes and mediastinal contours remain within normal limits. Allowing for portable technique the lungs are clear. No pneumothorax or pleural effusion. Visualized tracheal air column is within normal limits. Negative visible bowel gas pattern and osseous structures.  IMPRESSION: Stable and negative portable chest. Electronically Signed   By: Odessa Fleming M.D.   On: 11/26/2019 02:26    Procedures Procedures   Medications Ordered in ED Medications  morphine 4 MG/ML injection 4 mg (has no administration in time range)  iohexol (OMNIPAQUE) 350 MG/ML injection 100 mL (100 mLs Intravenous Contrast Given 11/26/19 0331)    ED Course  I have reviewed the triage vital signs and the nursing notes.  Pertinent labs & imaging results that were available during my care of the patient were reviewed by me and considered in my medical decision making (see chart for details).  MDM Rules/Calculators/A&P Chest pain in the setting of COVID-19.  I suspect that this is chest wall pain from her coughing.  ECG shows no acute changes.  Will recheck chest x-ray and will also check for D-dimer since there is an increased risk of pulmonary embolism in the setting of COVID-19.  Old records are reviewed confirming ED visit 2 days ago at which time chest x-ray was unremarkable.  Chest x-ray is unremarkable.  Labs are significant for mildly elevated D-dimer.  In the setting of COVID-19, it is decided to send her for CT angiogram of the chest which shows no evidence of pulmonary embolism but does show some groundglass densities consistent with COVID-19 pneumonia.  Patient is advised of these findings.  Because of Covid 19 infection, she should not take NSAIDs.  She is given a prescription for tramadol for pain but advised to use acetaminophen as her main pain control agent.  Advised to return if she starts having difficulty breathing.  Final Clinical Impression(s) / ED Diagnoses Final diagnoses:  Nonspecific chest pain  Pneumonia due to COVID-19 virus    Rx / DC Orders ED Discharge Orders         Ordered    traMADol (ULTRAM) 50 MG tablet  Every 6 hours PRN     11/26/19 0408           Dione Booze, MD 11/26/19 [phone]

## 2019-11-26 NOTE — ED Triage Notes (Signed)
Pt c/o continued chest pain. Pt was seen on 1/10 for the same.

## 2019-12-05 ENCOUNTER — Ambulatory Visit: Payer: Self-pay | Admitting: General Surgery

## 2019-12-09 NOTE — Progress Notes (Signed)
Referring Provider: Ardyth Man, MD Primary Care Physician:  Ardyth Man, MD Primary GI: Dr. Jena Gauss   Chief Complaint  Patient presents with  . Gastroesophageal Reflux    Burning in esophagus,keeps her up at night,stomach grumbling    HPI:   Kathryn Hurley is a 44 y.o. female presenting today with a history of symptomatic hemorrhoids, s/p banding earlier this year. She underwent flex sig in Dec 2020 due to rectal pain, which was normal. 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 Referred to Surgery, with upcomping appointment scheduled for February 2021. History of chronic GERD. Last EGD in Feb 2018 with gastric erosion s/p biopsy.    She states rectal discomfort is not a concern now. Wants to focus on UGI concerns. Dysphagia/odynophagia onset 2 weeks ago. Upper abdominal pain, sore, "hurts". Feels like stomach is going crazy. Grumbling. Recently started taking omeprazole BID but not helping. Has to sit straight up at night. Can't lay down. Dexilant samples helped well for GERD. No longer taking. Feels burning in chest. Having intermittent episodes. Only eating soups. If eating softer/solid foods, will cause worse burning. Eating smaller amounts. If eating larger amounts, will have abdominal pain. States had white areas in her mouth: used water, vinegar, salt and gargled. Now gone. Previously had only taken omeprazole sporadically.   Taking PPI then eating right away. Ibuprofen sporadically.   Past Medical History:  Diagnosis Date  . Acid reflux   . Constipation     Past Surgical History:  Procedure Laterality Date  . BIOPSY N/A 01/08/2016   Procedure: BIOPSY;  Surgeon: Corbin Ade, MD;  Location: [facility];  Service: Endoscopy;  Laterality: N/A;  Antral erosions  . CHOLECYSTECTOMY    . COLONOSCOPY N/A 01/08/2016   RMR: normal  . ESOPHAGOGASTRODUODENOSCOPY N/A 01/08/2016   RMR: Gastric erosion s/p biopsy with negative H.pylori.   Marland Kitchen FLEXIBLE SIGMOIDOSCOPY N/A 11/05/2019   Dr.  Jena Gauss: normal, suspect occult anal fissure    Current Outpatient Medications  Medication Sig Dispense Refill  . alum & mag hydroxide-simeth (MAALOX/MYLANTA) 200-200-20 MG/5ML suspension Take by mouth 3 (three) times daily.    . calcium carbonate (TUMS - DOSED IN MG ELEMENTAL CALCIUM) 500 MG chewable tablet Chew 3 tablets by mouth as needed for indigestion or heartburn.    Marland Kitchen omeprazole (PRILOSEC) 20 MG capsule Take 1 po BID x 2 weeks then once a day (Patient taking differently: 2 (two) times daily. Take 1 po BID x 2 weeks then once a day) 60 capsule 0  . dexlansoprazole (DEXILANT) 60 MG capsule Take 1 capsule (60 mg total) by mouth daily. 90 capsule 3  . sucralfate (CARAFATE) 1 g tablet Take 1 tablet (1 g total) by mouth 4 (four) times daily -  with meals and at bedtime. 120 tablet 1   No current facility-administered medications for this visit.    Allergies as of 12/10/2019  . (No Known Allergies)    Family History  Problem Relation Age of Onset  . Colon cancer Neg Hx     Social History   Socioeconomic History  . Marital status: Married    Spouse name: Not on file  . Number of children: Not on file  . Years of education: Not on file  . Highest education level: Not on file  Occupational History  . Not on file  Tobacco Use  . Smoking status: Never Smoker  . Smokeless tobacco: Never Used  Substance and Sexual Activity  . Alcohol  use: No    Alcohol/week: 0.0 standard drinks  . Drug use: No  . Sexual activity: Not on file  Other Topics Concern  . Not on file  Social History Narrative  . Not on file   Social Determinants of Health   Financial Resource Strain:   . Difficulty of Paying Living Expenses: Not on file  Food Insecurity:   . Worried About Programme researcher, broadcasting/film/video in the Last Year: Not on file  . Ran Out of Food in the Last Year: Not on file  Transportation Needs:   . Lack of Transportation (Medical): Not on file  . Lack of Transportation (Non-Medical): Not on file   Physical Activity:   . Days of Exercise per Week: Not on file  . Minutes of Exercise per Session: Not on file  Stress:   . Feeling of Stress : Not on file  Social Connections:   . Frequency of Communication with Friends and Family: Not on file  . Frequency of Social Gatherings with Friends and Family: Not on file  . Attends Religious Services: Not on file  . Active Member of Clubs or Organizations: Not on file  . Attends Banker Meetings: Not on file  . Marital Status: Not on file    Review of Systems: Gen: Denies fever, chills, anorexia. Denies fatigue, weakness, weight loss.  CV: Denies chest pain, palpitations, syncope, peripheral edema, and claudication. Resp: Denies dyspnea at rest, cough, wheezing, coughing up blood, and pleurisy. GI: Denies vomiting blood, jaundice, and fecal incontinence.   Denies dysphagia or odynophagia. Derm: Denies rash, itching, dry skin Psych: Denies depression, anxiety, memory loss, confusion. No homicidal or suicidal ideation.  Heme: Denies bruising, bleeding, and enlarged lymph nodes.  Physical Exam: BP 109/73   Pulse 87   Temp (!) 96.9 F (36.1 C)   Ht 5\' 6"  (1.676 m)   Wt 199 lb 9.6 oz (90.5 kg)   LMP 12/10/2019   BMI 32.22 kg/m  General:   Alert and oriented. No distress noted. Pleasant and cooperative.  Head:  Normocephalic and atraumatic. Eyes:  Conjuctiva clear without scleral icterus. Mouth:  Oral mucosa pink and moist. No obvious thrush.  Abdomen:  +BS, soft, non-tender and non-distended. No rebound or guarding. No HSM or masses noted. Msk:  Symmetrical without gross deformities. Normal posture. Extremities:  Without edema. Neurologic:  Alert and  oriented x4 Psych:  Alert and cooperative. Normal mood and affect.  ASSESSMENT: Kathryn Hurley is a 44 y.o. female presenting today with uncontrolled GERD, new onset dysphagia/odynophagia, and epigastric discomfort. Notably, she has not taken a PPI routinely and difficulty  finding relief despite BID dosing for the past few weeks. She does describe what sounds like oral thrush recently but I do not appreciate this on exam. Intermittent NSAID use also noted but not routine.   Recommend EGD/dilation in future. Will ensure she is taking PPI appropriately before meals, add Pepcid at night as needed, and carafate for short-term.   She does note Dexilant was helpful historically, but we do not have samples of this. I have provided a prescription that she can fill once her prescription coverage starts.    PLAN:   Proceed with upper endoscopy/dilation in the near future with Dr. 59 using PROPOFOL. The risks, benefits, and alternatives have been discussed in detail with patient. They have stated understanding and desire to proceed.   Prilosec BID 30 minutes before breakfast and dinner  Pepcid at night as needed. Carafate prescription sent  to pharmacy  Avoidance of all NSAIDs  I have also provided Dexilant prescription when she is able to get this  Further recommendations to follow  Annitta Needs, PhD, ANP-BC [facility]

## 2019-12-10 ENCOUNTER — Other Ambulatory Visit: Payer: Self-pay

## 2019-12-10 ENCOUNTER — Ambulatory Visit (INDEPENDENT_AMBULATORY_CARE_PROVIDER_SITE_OTHER): Payer: Self-pay | Admitting: Gastroenterology

## 2019-12-10 ENCOUNTER — Encounter: Payer: Self-pay | Admitting: Gastroenterology

## 2019-12-10 VITALS — BP 109/73 | HR 87 | Temp 96.9°F | Ht 66.0 in | Wt 199.6 lb

## 2019-12-10 DIAGNOSIS — R1310 Dysphagia, unspecified: Secondary | ICD-10-CM

## 2019-12-10 DIAGNOSIS — K219 Gastro-esophageal reflux disease without esophagitis: Secondary | ICD-10-CM

## 2019-12-10 MED ORDER — DEXLANSOPRAZOLE 60 MG PO CPDR: 60.0000 mg | DELAYED_RELEASE_CAPSULE | Freq: Every day | ORAL | 3 refills | Status: DC

## 2019-12-10 MED ORDER — SUCRALFATE 1 G PO TABS: 1.0000 g | ORAL_TABLET | Freq: Three times a day (TID) | ORAL | 1 refills | Status: DC

## 2019-12-10 NOTE — Patient Instructions (Signed)
We do not have any samples of Dexilant, unfortunately. For now, until you can the prescription completed, take omeprazole 30 minutes before breakfast and 30 minutes before dinner. Make sure this is on an empty stomach so that it can be absorbed well.  You can take famotidine (pepcid) at night to twice a day. This is over-the-counter.  I also gave a prescription for carafate. You can use the good rx savings card to help offset the cost so you can get it now. You will crush the tablet and mix with water, then drink 4 times a day 30 minutes before meals.  We have scheduled an upper endoscopy with dilation by Dr. Jena Gauss in the near future.  Do not eat for at least 2-3 hours before bedtime.  Avoid anything like Ibuprofen, Advil, Aleve, Motrin, as these can cause ulcers and stomach irritation.  I enjoyed seeing you again today! As you know, I value our relationship and want to provide genuine, compassionate, and quality care. I welcome your feedback. If you receive a survey regarding your visit,  I greatly appreciate you taking time to fill this out. See you next time!  Gelene Mink, PhD, ANP-BC [facility]

## 2019-12-19 ENCOUNTER — Encounter: Payer: Self-pay | Admitting: General Surgery

## 2019-12-19 ENCOUNTER — Ambulatory Visit (INDEPENDENT_AMBULATORY_CARE_PROVIDER_SITE_OTHER): Payer: No Typology Code available for payment source | Admitting: General Surgery

## 2019-12-19 ENCOUNTER — Other Ambulatory Visit: Payer: Self-pay

## 2019-12-19 VITALS — BP 116/73 | HR 106 | Temp 97.6°F | Resp 16 | Ht 66.0 in | Wt 198.0 lb

## 2019-12-19 DIAGNOSIS — K600 Acute anal fissure: Secondary | ICD-10-CM | POA: Diagnosis not present

## 2019-12-19 NOTE — Patient Instructions (Addendum)
Recoge tu crema en la botica [place]. Aplicar hasta cuatro veces al da. Baos de asiento segn sea necesario y despus de defecar. Pick up your cream from [place].  Apply up to 4 times daily.  Sitz baths as needed and after defecation.   Fisura anal en los adultos Anal Fissure, Adult  Una fisura anal es un pequeo desgarro o un corte en el tejido que rodea el orificio entre las nalgas (ano). El sangrado proveniente del desgarro o el corte suele detenerse solo despus de algunos minutos. Es probable que note Psychologist, educational materia fecal, hasta tanto el desgarro o el corte cicatricen. Cules son las causas? Generalmente, la causa de esta afeccin es la evacuacin de deposiciones (heces) duras o voluminosas. Otras causas son:  Dificultad para defecar (estreimiento).  Tener deposiciones lquidas (diarrea).  Enfermedad inflamatoria del intestino (enfermedad de Crohn o colitis ulcerosa).  Parto.  Infecciones.  Sexo anal. Cules son los signos o los sntomas? Los sntomas de esta afeccin incluyen:  Sangrado proveniente del ano.  Pequeas cantidades de [employer] . La sangre recubre el exterior de las deposiciones. No est mezclada con las deposiciones.  Pequeas cantidades de sangre en el papel higinico o en el inodoro despus de mover el intestino.  Dolor al Licensed conveyancer intestino.  Picazn o irritacin alrededor del [employer]. Cmo se diagnostica? Esta afeccin se puede diagnosticar con un examen fsico. El mdico puede:  Revisarle el ano. Un desgarro generalmente puede verse al revisar la zona con atencin.  Revisarle el ano usando un tubo corto (anoscopio). La luz en el tubo mostrar si hay problemas en el ano. Cmo se trata? El tratamiento de esta afeccin puede incluir lo siguiente:  Warehouse manager los problemas que hacen que le sea difcil mover el intestino. Es posible que le indiquen lo siguiente: ? Consumir ms fibra. ? Beber  ms lquido. ? Tomar suplementos de [place]. ? Tomar medicamentos que ablandan las deposiciones.  Tomar baos de asiento. Esto puede ayudar a Naval architect.  Utilizar cremas y ungentos. Si la afeccin empeora, puede ser necesario recurrir a otros tratamientos, por ejemplo:  Una inyeccin cerca del desgarro o corte (inyeccin de toxina botulnica).  Ciruga para reparar el desgarro o corte. Siga estas indicaciones en su casa: Comida y bebida   No consuma bananas ni productos lcteos. Estos alimentos pueden causar estreimiento.  Beba suficiente lquido para Radio producer pis (la orina) de color amarillo plido.  Consuma alimentos que contengan mucha fibra, por ejemplo: ? Frijoles. ? Cereales integrales. ? Frutas frescas. ? Verduras frescas. Indicaciones generales   [employer] de venta libre y los recetados solamente como se lo haya indicado el mdico.  Use cremas o ungentos solamente como se lo haya indicado el mdico.  Mantenga la zona anal tan limpia y seca como sea posible.  Dese un bao de agua tibia (bao de asiento) como se lo haya indicado el mdico. No use jabn para limpiar la zona afectada.  Concurra a todas las visitas de control como se lo haya indicado el mdico. Esto es importante. Comunquese con un mdico si:  Aumenta el sangrado.  Tiene fiebre.  Tiene heces acuosas mezcladas con sangre.  Siente dolor.  Su problema empeora, en lugar de mejorar. Resumen  Una fisura anal es un pequeo desgarro o un corte en la piel que rodea el orificio del conducto anal (ano).  Generalmente, la causa de esta afeccin es la evacuacin de deposiciones (heces) duras  o voluminosas.  El tratamiento incluye tratar los problemas que hacen que le sea difcil mover el intestino.  Siga las indicaciones del mdico acerca del cuidado de su afeccin en su casa.  Concurra a todas las visitas de control como se lo haya indicado el mdico. Esto es  importante. Esta informacin no tiene Theme park manager el consejo del mdico. Asegrese de hacerle al mdico cualquier pregunta que tenga. Document Revised: 06/11/2018 Document Reviewed: 06/11/2018 Elsevier Patient Education  2020 Elsevier Inc.  Anal Fissure, Adult  An anal fissure is a small tear or crack in the tissue around the opening of the butt (anus). Bleeding from the tear or crack usually stops on its own within a few minutes. The bleeding may happen every time you poop (have a bowel movement) until the tear or crack heals. What are the causes? This condition is usually caused by passing a large or hard poop (stool). Other causes include: Trouble pooping (constipation). Passing watery poop (diarrhea). Inflammatory bowel disease (Crohn's disease or ulcerative colitis). Childbirth. Infections. Anal sex. What are the signs or symptoms? Symptoms of this condition include: Bleeding from the butt. Small amounts of blood on your poop. The blood coats the outside of the poop. It is not mixed with the poop. Small amounts of blood on the toilet paper or in the toilet after you poop. Pain when passing poop. Itching or irritation around the opening of the butt. How is this diagnosed? This condition may be diagnosed based on a physical exam. Your doctor may: Check your butt. A tear can often be seen by checking the area with care. Check your butt using a short tube (anoscope). The light in the tube will show any problems in your butt. How is this treated? Treatment for this condition may include: Treating problems that make it hard for you to pass poop. You may be told to: Eat more fiber. Drink more fluid. Take fiber supplements. Take medicines that make poop soft. Taking sitz baths. This may help to heal the tear. Using creams and ointments. If your condition gets worse, other treatments may be needed such as: A shot near the tear or crack (botulinum injection). Surgery to  repair the tear or crack. Follow these instructions at home: Eating and drinking  Avoid bananas and dairy products. These foods can make it hard to poop. Drink enough fluid to keep your pee (urine) pale yellow. Eat foods that have a lot of fiber in them, such as: Beans. Whole grains. Fresh fruits. Fresh vegetables. General instructions  Take over-the-counter and prescription medicines only as told by your doctor. Use creams or ointments only as told by your doctor. Keep the butt area as clean and dry as you can. Take a warm water bath (sitz bath) as told by your doctor. Do not use soap. Keep all follow-up visits as told by your doctor. This is important. Contact a doctor if: You have more bleeding. You have a fever. You have watery poop that is mixed with blood. You have pain. Your problem gets worse, not better. Summary An anal fissure is a small tear or crack in the skin around the opening of the butt (anus). This condition is usually caused by passing a large or hard poop (stool). Treatment includes treating the problems that make it hard for you to pass poop. Follow your doctor's instructions about caring for your condition at home. Keep all follow-up visits as told by your doctor. This is important. This information is not  intended to replace advice given to you by your health care provider. Make sure you discuss any questions you have with your health care provider. Document Revised: 04/12/2018 Document Reviewed: 04/12/2018 Elsevier Patient Education  2020 Elsevier Inc.   Cmo tomar un bao de asiento How to Take a [employer] Un bao de asiento es un bao de agua tibia que se puede usar para cuidar el recto, la zona genital o la zona entre el recto y los genitales (perineo). En un bao de asiento, el agua solamente llega [employer] caderas y [place] las nalgas. Un bao de asiento puede [employer] hogar en la baera o en una tina porttil para bao de asiento que se coloca  sobre el inodoro. Su mdico puede recomendar un bao de asiento para ayudarlo con lo siguiente:  Engineer, materials y las molestias despus de dar a Patent examiner.  Aliviar el dolor y la picazn causados por las hemorroides o las fisuras anales.  Aliviar el dolor despus de determinadas cirugas.  Relajar los msculos doloridos o tensos. Cmo tomar un bao de [employer] 3 o 4baos de asiento diarios o tantos como se lo haya indicado el mdico. Bao de asiento en la baera Para tomar un bao de asiento en una baera: 1. Llene parte de la baera con agua tibia. El agua debe tener la profundidad suficiente para cubrirle las caderas y las nalgas cuando est sentado en la baera. 2. Si su mdico le indic que ponga medicamentos en el agua, siga sus instrucciones. 3. Sintese en el agua. 4. Delsa Bern un poco el drenaje de la baera y djelo abierto durante su bao. 5. Abra el agua tibia nuevamente, lo suficiente para reponer Firefighter. Deje correr el agua durante todo su bao. Esto ayuda a Surveyor, minerals en el nivel adecuado y a Landscape architect. 6. Sumrjase en el agua entre 15 y 20 minutos, o el tiempo que le haya indicado el mdico. 7. Cuando termine, tenga cuidado al ponerse de pie. Puede sentirse mareado. 8. Luego del bao de asiento, squese con golpecitos suaves. No frote la piel para secarla.  Bao de asiento sobre el inodoro Para tomar un bao de asiento con un recipiente sobre el inodoro: 1. Siga las instrucciones del fabricante. 2. Llene el recipiente con agua tibia. 3. Si su mdico le indic que ponga medicamentos en el agua, siga sus instrucciones. 4. Sintese en el asiento. Asegrese de que el agua le cubra las nalgas y el perineo. 5. Sumrjase en el agua entre 15 y 20 minutos, o el tiempo que le haya indicado el mdico. 6. Luego del bao de asiento, squese con golpecitos suaves. No frote la piel para secarla. 7. Limpie y seque la tina despus de cada uso. 8. Deseche el  recipiente si se agrieta o segn las instrucciones del fabricante. Comunquese con un mdico si:  Los sntomas empeoran. No contine con los baos de asiento si sus sntomas empeoran.  Aparecen nuevos sntomas. Si esto ocurre, no contine con los baos de asiento hasta hablar con su mdico. Resumen  Un bao de asiento es un bao con agua tibia en el cual el agua solo le llega hasta la cadera y cubre las nalgas.  Un bao de asiento puede [employer] picazn y Chief Technology Officer, y International aid/development worker los msculos doloridos o tensos en la parte inferior del cuerpo, incluida la zona genital.  [place] 3 o 4baos de asiento diarios o tantos como se lo haya indicado el  mdico. Sumrjase en el agua entre 15 y 62 minutos.  No contine con los baos de asiento si los sntomas empeoran. Esta informacin no tiene Marine scientist el consejo del mdico. Asegrese de hacerle al mdico cualquier pregunta que tenga. Document Revised: 12/09/2017 Document Reviewed: 12/09/2017 Elsevier Patient Education  2020 [employer].   How to Take a [employer] A sitz bath is a warm water bath that may be used to care for your rectum, genital area, or the area between your rectum and genitals (perineum). For a sitz bath, the water only comes up to your hips and covers your buttocks. A sitz bath may done at home in a bathtub or with a portable sitz bath that fits over the toilet. Your health care provider may recommend a sitz bath to help:  Relieve pain and discomfort after delivering a baby.  Relieve pain and itching from hemorrhoids or anal fissures.  Relieve pain after certain surgeries.  Relax muscles that are sore or tight. How to take a sitz bath Take 3-4 sitz baths a day, or as many as told by your health care provider. Bathtub sitz bath To take a sitz bath in a bathtub: 1. Partially fill a bathtub with warm water. The water should be deep enough to cover your hips and buttocks when you are sitting in the tub. 2. If your  health care provider told you to put medicine in the water, follow his or her instructions. 3. Sit in the water. 4. Open the tub drain a little, and leave it open during your bath. 5. Turn on the warm water again, enough to replace the water that is draining out. Keep the water running throughout your bath. This helps keep the water at the right level and the right temperature. 6. Soak in the water for 15-20 minutes, or as long as told by your health care provider. 7. When you are done, be careful when you stand up. You may feel dizzy. 8. After the sitz bath, pat yourself dry. Do not rub your skin to dry it.  Over-the-toilet sitz bath To take a sitz bath with an over-the-toilet basin: 1. Follow the manufacturer's instructions. 2. Fill the basin with warm water. 3. If your health care provider told you to put medicine in the water, follow his or her instructions. 4. Sit on the seat. Make sure the water covers your buttocks and perineum. 5. Soak in the water for 15-20 minutes, or as long as told by your health care provider. 6. After the sitz bath, pat yourself dry. Do not rub your skin to dry it. 7. Clean and dry the basin between uses. 8. Discard the basin if it cracks, or according to the manufacturer's instructions. Contact a health care provider if:  Your symptoms get worse. Do not continue with sitz baths if your symptoms get worse.  You have new symptoms. If this happens, do not continue with sitz baths until you talk with your health care provider. Summary  A sitz bath is a warm water bath in which the water only comes up to your hips and covers your buttocks.  A sitz bath may help relieve itching, relieve pain, and relax muscles that are sore or tight in the lower part of your body, including your genital area.  Take 3-4 sitz baths a day, or as many as told by your health care provider. Soak in the water for 15-20 minutes.  Do not continue with sitz baths if your symptoms  get  worse. This information is not intended to replace advice given to you by your health care provider. Make sure you discuss any questions you have with your health care provider. Document Revised: 04/01/2019 Document Reviewed: 11/02/2017 Elsevier Patient Education  2020 [employer].

## 2019-12-19 NOTE — Progress Notes (Signed)
[facility] History and Physical  Reason for Referral: Anal fissure  Referring Physician:  Dr. Jena Gauss   Chief Complaint    Anal Fissure      Kathryn Hurley is a 44 y.o. female.  HPI: Kathryn Hurley is a very sweet 44 yo who has had issues with diarrhea and abdominal pain as well as bloody stools. She has had endoscopy in the past as well as hemorrhoid banding. She recently had a flexible sigmoidoscopy for rectal bleeding and pain. This did not demonstrate anything concerning but there was concern for a fissure. Looking at her chart it looks like she may have had a fissure in 2016 as well. At that time she was given nitroglycerin compound from [place]. She does not recall this. She says the creams she has used recently were itching and she stopped.   Currently her symptom she is most worried about is the diarrhea. She describes some sharp pain with defecation but nothing that sounds severe or regular. She describes feeling a bump in her anal region too.  She had COVID 3 weeks ago and since that time she has had loose stools. She denies any issues with constipation recently.   She denies report some urinary incontinence and has had 3 children vaginally.   Past Medical History:  Diagnosis Date  . Acid reflux   . Constipation     Past Surgical History:  Procedure Laterality Date  . BIOPSY N/A 01/08/2016   Procedure: BIOPSY;  Surgeon: Corbin Ade, MD;  Location: [facility];  Service: Endoscopy;  Laterality: N/A;  Antral erosions  . CHOLECYSTECTOMY    . COLONOSCOPY N/A 01/08/2016   RMR: normal  . ESOPHAGOGASTRODUODENOSCOPY N/A 01/08/2016   RMR: Gastric erosion s/p biopsy with negative H.pylori.   Marland Kitchen FLEXIBLE SIGMOIDOSCOPY N/A 11/05/2019   Dr. Jena Gauss: normal, suspect occult anal fissure    Family History  Problem Relation Age of Onset  . Colon cancer Neg Hx     Social History   Tobacco Use  . Smoking status: Never Smoker  . Smokeless tobacco: Never Used   Substance Use Topics  . Alcohol use: No    Alcohol/week: 0.0 standard drinks  . Drug use: No    Medications: I have reviewed the patient's current medications. Allergies as of 12/19/2019   No Known Allergies     Medication List       Accurate as of December 19, 2019  2:12 PM. If you have any questions, ask your nurse or doctor.        alum & mag hydroxide-simeth 200-200-20 MG/5ML suspension Commonly known as: MAALOX/MYLANTA Take by mouth 3 (three) times daily.   calcium carbonate 500 MG chewable tablet Commonly known as: TUMS - dosed in mg elemental calcium Chew 3 tablets by mouth as needed for indigestion or heartburn.   dexlansoprazole 60 MG capsule Commonly known as: DEXILANT Take 1 capsule (60 mg total) by mouth daily.   omeprazole 20 MG capsule Commonly known as: PRILOSEC Take 1 po BID x 2 weeks then once a day What changed: when to take this   sucralfate 1 g tablet Commonly known as: Carafate Take 1 tablet (1 g total) by mouth 4 (four) times daily -  with meals and at bedtime.        ROS:  A comprehensive review of systems was negative except for: Gastrointestinal: positive for abdominal pain, diarrhea and reflux symptoms  Blood pressure 116/73, pulse (!) 106, temperature 97.6 F (36.4 C),  temperature source Oral, resp. rate 16, height 5\' 6"  (1.676 m), weight 198 lb (89.8 kg), last menstrual period 12/10/2019, SpO2 98 %. Physical Exam Vitals reviewed. Exam conducted with a chaperone present.  Constitutional:      Appearance: Normal appearance.  HENT:     Head: Normocephalic and atraumatic.     Nose: Nose normal.     Mouth/Throat:     Mouth: Mucous membranes are moist.  Eyes:     Extraocular Movements: Extraocular movements intact.     Pupils: Pupils are equal, round, and reactive to light.  Cardiovascular:     Rate and Rhythm: Normal rate and regular rhythm.  Pulmonary:     Effort: Pulmonary effort is normal.     Breath sounds: Normal breath sounds.   Abdominal:     General: There is no distension.     Palpations: Abdomen is soft.     Tenderness: There is no abdominal tenderness.  Genitourinary:    Rectum: Tenderness and external hemorrhoid present. Normal anal tone.     Comments: Posterior fissure looks chronic, no skin tag, tender on exam posteriorly but otherwise normal tone, minor external hemorrhoid in the right anterior position, minor, no signs of bleeding Musculoskeletal:        General: No swelling. Normal range of motion.     Cervical back: Normal range of motion. No rigidity.  Skin:    General: Skin is warm and dry.  Neurological:     General: No focal deficit present.     Mental Status: She is alert and oriented to person, place, and time.  Psychiatric:        Mood and Affect: Mood normal.        Behavior: Behavior normal.        Thought Content: Thought content normal.        Judgment: Judgment normal.     Results: None   Assessment & Plan:  Kathryn Hurley is a 44 y.o. female with a fissure posterior and some evidence of some hemorrhoids externally but nothing severe. She is mostly complaining about the diarrhea and is worried that her rectal problems and pain are causing the diarrhea. I explained to her that this is not the case. I explained that a fissure normally causes pain with defecation and that it can cause some bleeding. She may also feel pressure and soreness. I explained that she likely has some rectal pressure too from pelvic floor dysfunction from her vaginal child births given her urinary incontinence.   We discussed that fissure are treated first medically and that I would prescribe a different compounded cream with Diltiazem 2%/ Lidocaine 5% QID to the anal region. I explained that this cream will relax the sphincter muscle and allow her anal tear to heal.  Encouraged her to keep her stools loose and to do Sitz baths regularly. Discussed that a lateral internal sphincterotomy could be done for a non  healing fissure but that this runs the risk of injury to the sphincter and incontinence.   Will see her back in a few weeks to see if the cream is working. Called into [place].   All questions were answered to the satisfaction of the patient.   Virl Cagey 12/19/2019, 2:12 PM

## 2019-12-25 NOTE — Patient Instructions (Signed)
Kathryn Hurley  12/25/2019     @PREFPERIOPPHARMACY @   Your procedure is scheduled on  01/02/2020 .  Report to 01/04/2020 at  0745  A.M.  Call this number if you have problems the morning of surgery:  [phone]   Remember:  Follow the diet instructions given to you by Dr [phone] office.                Take these medicines the morning of surgery with A SIP OF WATER  Omeprazole.    Do not wear jewelry, make-up or nail polish.  Do not wear lotions, powders, or perfumes. Please wear deodorant and brush your teeth.  Do not shave 48 hours prior to surgery.  Men may shave face and neck.  Do not bring valuables to the hospital.  [facility] is not responsible for any belongings or valuables.  Contacts, dentures or bridgework may not be worn into surgery.  Leave your suitcase in the car.  After surgery it may be brought to your room.  For patients admitted to the hospital, discharge time will be determined by your treatment team.  Patients discharged the day of surgery will not be allowed to drive home.   Name and phone number of your driver:   family Special instructions:  DO NOT smoke the morning of your procedure.  Please read over the following fact sheets that you were given. Anesthesia Post-op Instructions and Care and Recovery After Surgery       Upper Endoscopy, Adult, Care After This sheet gives you information about how to care for yourself after your procedure. Your health care provider may also give you more specific instructions. If you have problems or questions, contact your health care provider. What can I expect after the procedure? After the procedure, it is common to have:  A sore throat.  Mild stomach pain or discomfort.  Bloating.  Nausea. Follow these instructions at home:   Follow instructions from your health care provider about what to eat or drink after your procedure.  Return to your normal activities as told by your health care  provider. Ask your health care provider what activities are safe for you.  Take over-the-counter and prescription medicines only as told by your health care provider.  Do not drive for 24 hours if you were given a sedative during your procedure.  Keep all follow-up visits as told by your health care provider. This is important. Contact a health care provider if you have:  A sore throat that lasts longer than one day.  Trouble swallowing. Get help right away if:  You vomit blood or your vomit looks like coffee grounds.  You have: ? A fever. ? Bloody, black, or tarry stools. ? A severe sore throat or you cannot swallow. ? Difficulty breathing. ? Severe pain in your chest or abdomen. Summary  After the procedure, it is common to have a sore throat, mild stomach discomfort, bloating, and nausea.  Do not drive for 24 hours if you were given a sedative during the procedure.  Follow instructions from your health care provider about what to eat or drink after your procedure.  Return to your normal activities as told by your health care provider. This information is not intended to replace advice given to you by your health care provider. Make sure you discuss any questions you have with your health care provider. Document Revised: 04/24/2018 Document Reviewed: 04/02/2018 Elsevier Patient Education  [employer].  Esophageal Dilatation Esophageal dilatation, also called esophageal dilation, is a procedure to widen or open (dilate) a blocked or narrowed part of the esophagus. The esophagus is the part of the body that moves food and liquid from the mouth to the stomach. You may need this procedure if:  You have a buildup of scar tissue in your esophagus that makes it difficult, painful, or impossible to swallow. This can be caused by gastroesophageal reflux disease (GERD).  You have cancer of the esophagus.  There is a problem with how food moves through your esophagus. In some  cases, you may need this procedure repeated at a later time to dilate the esophagus gradually. Tell a health care provider about:  Any allergies you have.  All medicines you are taking, including vitamins, herbs, eye drops, creams, and over-the-counter medicines.  Any problems you or family members have had with anesthetic medicines.  Any blood disorders you have.  Any surgeries you have had.  Any medical conditions you have.  Any antibiotic medicines you are required to take before dental procedures.  Whether you are pregnant or may be pregnant. What are the risks? Generally, this is a safe procedure. However, problems may occur, including:  Bleeding due to a tear in the lining of the esophagus.  A hole (perforation) in the esophagus. What happens before the procedure?  Follow instructions from your health care provider about eating or drinking restrictions.  Ask your health care provider about changing or stopping your regular medicines. This is especially important if you are taking diabetes medicines or blood thinners.  Plan to have someone take you home from the hospital or clinic.  Plan to have a responsible adult care for you for at least 24 hours after you leave the hospital or clinic. This is important. What happens during the procedure?  You may be given a medicine to help you relax (sedative).  A numbing medicine may be sprayed into the back of your throat, or you may gargle the medicine.  Your health care provider may perform the dilatation using various surgical instruments, such as: ? Simple dilators. This instrument is carefully placed in the esophagus to stretch it. ? Guided wire bougies. This involves using an endoscope to insert a wire into the esophagus. A dilator is passed over this wire to enlarge the esophagus. Then the wire is removed. ? Balloon dilators. An endoscope with a small balloon at the end is inserted into the esophagus. The balloon is  inflated to stretch the esophagus and open it up. The procedure may vary among health care providers and hospitals. What happens after the procedure?  Your blood pressure, heart rate, breathing rate, and blood oxygen level will be monitored until the medicines you were given have worn off.  Your throat may feel slightly sore and numb. This will improve slowly over time.  You will not be allowed to eat or drink until your throat is no longer numb.  When you are able to drink, urinate, and sit on the edge of the bed without nausea or dizziness, you may be able to return home. Follow these instructions at home:  Take over-the-counter and prescription medicines only as told by your health care provider.  Do not drive for 24 hours if you were given a sedative during your procedure.  You should have a responsible adult with you for 24 hours after the procedure.  Follow instructions from your health care provider about any eating or  drinking restrictions.  Do not use any products that contain nicotine or tobacco, such as cigarettes and e-cigarettes. If you need help quitting, ask your health care provider.  Keep all follow-up visits as told by your health care provider. This is important. Get help right away if you:  Have a fever.  Have chest pain.  Have pain that is not relieved by medication.  Have trouble breathing.  Have trouble swallowing.  Vomit blood. Summary  Esophageal dilatation, also called esophageal dilation, is a procedure to widen or open (dilate) a blocked or narrowed part of the esophagus.  Plan to have someone take you home from the hospital or clinic.  For this procedure, a numbing medicine may be sprayed into the back of your throat, or you may gargle the medicine.  Do not drive for 24 hours if you were given a sedative during your procedure. This information is not intended to replace advice given to you by your health care provider. Make sure you discuss  any questions you have with your health care provider. Document Revised: 08/28/2019 Document Reviewed: 09/05/2017 Elsevier Patient Education  2020 [employer] Monitored Anesthesia Care, Care After These instructions provide you with information about caring for yourself after your procedure. Your health care provider may also give you more specific instructions. Your treatment has been planned according to current medical practices, but problems sometimes occur. Call your health care provider if you have any problems or questions after your procedure. What can I expect after the procedure? After your procedure, you may:  Feel sleepy for several hours.  Feel clumsy and have poor balance for several hours.  Feel forgetful about what happened after the procedure.  Have poor judgment for several hours.  Feel nauseous or vomit.  Have a sore throat if you had a breathing tube during the procedure. Follow these instructions at home: For at least 24 hours after the procedure:      Have a responsible adult stay with you. It is important to have someone help care for you until you are awake and alert.  Rest as needed.  Do not: ? Participate in activities in which you could fall or become injured. ? Drive. ? Use heavy machinery. ? Drink alcohol. ? Take sleeping pills or medicines that cause drowsiness. ? Make important decisions or sign legal documents. ? Take care of children on your own. Eating and drinking  Follow the diet that is recommended by your health care provider.  If you vomit, drink water, juice, or soup when you can drink without vomiting.  Make sure you have little or no nausea before eating solid foods. General instructions  Take over-the-counter and prescription medicines only as told by your health care provider.  If you have sleep apnea, surgery and certain medicines can increase your risk for breathing problems. Follow instructions from your health care  provider about wearing your sleep device: ? Anytime you are sleeping, including during daytime naps. ? While taking prescription pain medicines, sleeping medicines, or medicines that make you drowsy.  If you smoke, do not smoke without supervision.  Keep all follow-up visits as told by your health care provider. This is important. Contact a health care provider if:  You keep feeling nauseous or you keep vomiting.  You feel light-headed.  You develop a rash.  You have a fever. Get help right away if:  You have trouble breathing. Summary  For several hours after your procedure, you may feel sleepy and have poor  judgment.  Have a responsible adult stay with you for at least 24 hours or until you are awake and alert. This information is not intended to replace advice given to you by your health care provider. Make sure you discuss any questions you have with your health care provider. Document Revised: 01/29/2018 Document Reviewed: 02/21/2016 Elsevier Patient Education  2020 [employer].

## 2019-12-31 ENCOUNTER — Encounter (HOSPITAL_COMMUNITY)
Admission: RE | Admit: 2019-12-31 | Discharge: 2019-12-31 | Disposition: A | Discharge status: Home / Self Care | Payer: PRIVATE HEALTH INSURANCE | Source: Ambulatory Visit | Attending: Internal Medicine | Admitting: Internal Medicine

## 2019-12-31 ENCOUNTER — Other Ambulatory Visit: Payer: Self-pay

## 2019-12-31 ENCOUNTER — Encounter (HOSPITAL_COMMUNITY): Payer: Self-pay

## 2019-12-31 DIAGNOSIS — Z01812 Encounter for preprocedural laboratory examination: Secondary | ICD-10-CM | POA: Insufficient documentation

## 2019-12-31 LAB — HCG, SERUM, QUALITATIVE: Preg, Serum: NEGATIVE

## 2020-01-01 ENCOUNTER — Telehealth: Payer: Self-pay

## 2020-01-01 NOTE — Telephone Encounter (Signed)
EGD/DIL w/RMR rescheduled to 01/31/20 at 9:00am d/t inclement weather. New instructions and COVID test mailed.

## 2020-01-16 ENCOUNTER — Ambulatory Visit: Payer: No Typology Code available for payment source | Admitting: General Surgery

## 2020-01-24 NOTE — Patient Instructions (Signed)
Kathryn Hurley  01/24/2020     @PREFPERIOPPHARMACY @   Your procedure is scheduled on  01/31/2020 .  Report to 02/02/2020 at  0730   A.M.  Call this number if you have problems the morning of surgery:  [phone]   Remember:  Follow the diet instructions given to you by Dr [phone] office.                     Take these medicines the morning of surgery with A SIP OF WATER  Pepcid, prilosec.    Do not wear jewelry, make-up or nail polish.  Do not wear lotions, powders, or perfumes. Please wear deodorant and brush your teeth.  Do not shave 48 hours prior to surgery.  Men may shave face and neck.  Do not bring valuables to the hospital.  [facility] is not responsible for any belongings or valuables.  Contacts, dentures or bridgework may not be worn into surgery.  Leave your suitcase in the car.  After surgery it may be brought to your room.  For patients admitted to the hospital, discharge time will be determined by your treatment team.  Patients discharged the day of surgery will not be allowed to drive home.   Name and phone number of your driver:   family Special instructions:  DO NOT smoke the day of your procedure.  Please read over the following fact sheets that you were given. Anesthesia Post-op Instructions and Care and Recovery After Surgery       Upper Endoscopy, Adult, Care After This sheet gives you information about how to care for yourself after your procedure. Your health care provider may also give you more specific instructions. If you have problems or questions, contact your health care provider. What can I expect after the procedure? After the procedure, it is common to have:  A sore throat.  Mild stomach pain or discomfort.  Bloating.  Nausea. Follow these instructions at home:   Follow instructions from your health care provider about what to eat or drink after your procedure.  Return to your normal activities as told by your health  care provider. Ask your health care provider what activities are safe for you.  Take over-the-counter and prescription medicines only as told by your health care provider.  Do not drive for 24 hours if you were given a sedative during your procedure.  Keep all follow-up visits as told by your health care provider. This is important. Contact a health care provider if you have:  A sore throat that lasts longer than one day.  Trouble swallowing. Get help right away if:  You vomit blood or your vomit looks like coffee grounds.  You have: ? A fever. ? Bloody, black, or tarry stools. ? A severe sore throat or you cannot swallow. ? Difficulty breathing. ? Severe pain in your chest or abdomen. Summary  After the procedure, it is common to have a sore throat, mild stomach discomfort, bloating, and nausea.  Do not drive for 24 hours if you were given a sedative during the procedure.  Follow instructions from your health care provider about what to eat or drink after your procedure.  Return to your normal activities as told by your health care provider. This information is not intended to replace advice given to you by your health care provider. Make sure you discuss any questions you have with your health care provider. Document Revised: 04/24/2018  Document Reviewed: 04/02/2018 Elsevier Patient Education  [place].  Esophageal Dilatation Esophageal dilatation, also called esophageal dilation, is a procedure to widen or open (dilate) a blocked or narrowed part of the esophagus. The esophagus is the part of the body that moves food and liquid from the mouth to the stomach. You may need this procedure if:  You have a buildup of scar tissue in your esophagus that makes it difficult, painful, or impossible to swallow. This can be caused by gastroesophageal reflux disease (GERD).  You have cancer of the esophagus.  There is a problem with how food moves through your esophagus. In  some cases, you may need this procedure repeated at a later time to dilate the esophagus gradually. Tell a health care provider about:  Any allergies you have.  All medicines you are taking, including vitamins, herbs, eye drops, creams, and over-the-counter medicines.  Any problems you or family members have had with anesthetic medicines.  Any blood disorders you have.  Any surgeries you have had.  Any medical conditions you have.  Any antibiotic medicines you are required to take before dental procedures.  Whether you are pregnant or may be pregnant. What are the risks? Generally, this is a safe procedure. However, problems may occur, including:  Bleeding due to a tear in the lining of the esophagus.  A hole (perforation) in the esophagus. What happens before the procedure?  Follow instructions from your health care provider about eating or drinking restrictions.  Ask your health care provider about changing or stopping your regular medicines. This is especially important if you are taking diabetes medicines or blood thinners.  Plan to have someone take you home from the hospital or clinic.  Plan to have a responsible adult care for you for at least 24 hours after you leave the hospital or clinic. This is important. What happens during the procedure?  You may be given a medicine to help you relax (sedative).  A numbing medicine may be sprayed into the back of your throat, or you may gargle the medicine.  Your health care provider may perform the dilatation using various surgical instruments, such as: ? Simple dilators. This instrument is carefully placed in the esophagus to stretch it. ? Guided wire bougies. This involves using an endoscope to insert a wire into the esophagus. A dilator is passed over this wire to enlarge the esophagus. Then the wire is removed. ? Balloon dilators. An endoscope with a small balloon at the end is inserted into the esophagus. The balloon is  inflated to stretch the esophagus and open it up. The procedure may vary among health care providers and hospitals. What happens after the procedure?  Your blood pressure, heart rate, breathing rate, and blood oxygen level will be monitored until the medicines you were given have worn off.  Your throat may feel slightly sore and numb. This will improve slowly over time.  You will not be allowed to eat or drink until your throat is no longer numb.  When you are able to drink, urinate, and sit on the edge of the bed without nausea or dizziness, you may be able to return home. Follow these instructions at home:  Take over-the-counter and prescription medicines only as told by your health care provider.  Do not drive for 24 hours if you were given a sedative during your procedure.  You should have a responsible adult with you for 24 hours after the procedure.  Follow instructions from your  health care provider about any eating or drinking restrictions.  Do not use any products that contain nicotine or tobacco, such as cigarettes and e-cigarettes. If you need help quitting, ask your health care provider.  Keep all follow-up visits as told by your health care provider. This is important. Get help right away if you:  Have a fever.  Have chest pain.  Have pain that is not relieved by medication.  Have trouble breathing.  Have trouble swallowing.  Vomit blood. Summary  Esophageal dilatation, also called esophageal dilation, is a procedure to widen or open (dilate) a blocked or narrowed part of the esophagus.  Plan to have someone take you home from the hospital or clinic.  For this procedure, a numbing medicine may be sprayed into the back of your throat, or you may gargle the medicine.  Do not drive for 24 hours if you were given a sedative during your procedure. This information is not intended to replace advice given to you by your health care provider. Make sure you discuss  any questions you have with your health care provider. Document Revised: 08/28/2019 Document Reviewed: 09/05/2017 Elsevier Patient Education  2020 [place] After These instructions provide you with information about caring for yourself after your procedure. Your health care provider may also give you more specific instructions. Your treatment has been planned according to current medical practices, but problems sometimes occur. Call your health care provider if you have any problems or questions after your procedure. What can I expect after the procedure? After your procedure, you may:  Feel sleepy for several hours.  Feel clumsy and have poor balance for several hours.  Feel forgetful about what happened after the procedure.  Have poor judgment for several hours.  Feel nauseous or vomit.  Have a sore throat if you had a breathing tube during the procedure. Follow these instructions at home: For at least 24 hours after the procedure:      Have a responsible adult stay with you. It is important to have someone help care for you until you are awake and alert.  Rest as needed.  Do not: ? Participate in activities in which you could fall or become injured. ? Drive. ? Use heavy machinery. ? Drink alcohol. ? Take sleeping pills or medicines that cause drowsiness. ? Make important decisions or sign legal documents. ? Take care of children on your own. Eating and drinking  Follow the diet that is recommended by your health care provider.  If you vomit, drink water, juice, or soup when you can drink without vomiting.  Make sure you have little or no nausea before eating solid foods. General instructions  Take over-the-counter and prescription medicines only as told by your health care provider.  If you have sleep apnea, surgery and certain medicines can increase your risk for breathing problems. Follow instructions from your health care  provider about wearing your sleep device: ? Anytime you are sleeping, including during daytime naps. ? While taking prescription pain medicines, sleeping medicines, or medicines that make you drowsy.  If you smoke, do not smoke without supervision.  Keep all follow-up visits as told by your health care provider. This is important. Contact a health care provider if:  You keep feeling nauseous or you keep vomiting.  You feel light-headed.  You develop a rash.  You have a fever. Get help right away if:  You have trouble breathing. Summary  For several hours after your procedure,  you may feel sleepy and have poor judgment.  Have a responsible adult stay with you for at least 24 hours or until you are awake and alert. This information is not intended to replace advice given to you by your health care provider. Make sure you discuss any questions you have with your health care provider. Document Revised: 01/29/2018 Document Reviewed: 02/21/2016 Elsevier Patient Education  [place].

## 2020-01-29 ENCOUNTER — Other Ambulatory Visit: Payer: Self-pay

## 2020-01-29 ENCOUNTER — Other Ambulatory Visit (HOSPITAL_COMMUNITY)
Admission: RE | Admit: 2020-01-29 | Discharge: 2020-01-29 | Disposition: A | Discharge status: Home / Self Care | Payer: PRIVATE HEALTH INSURANCE | Source: Ambulatory Visit | Attending: Internal Medicine | Admitting: Internal Medicine

## 2020-01-29 ENCOUNTER — Encounter (HOSPITAL_COMMUNITY)
Admission: RE | Admit: 2020-01-29 | Discharge: 2020-01-29 | Disposition: A | Discharge status: Home / Self Care | Payer: No Typology Code available for payment source | Source: Ambulatory Visit | Attending: Internal Medicine | Admitting: Internal Medicine

## 2020-01-29 ENCOUNTER — Encounter (HOSPITAL_COMMUNITY): Payer: Self-pay

## 2020-01-29 DIAGNOSIS — Z01812 Encounter for preprocedural laboratory examination: Secondary | ICD-10-CM | POA: Insufficient documentation

## 2020-01-29 DIAGNOSIS — Z20822 Contact with and (suspected) exposure to covid-19: Secondary | ICD-10-CM | POA: Diagnosis not present

## 2020-01-29 LAB — SARS CORONAVIRUS 2 (TAT 6-24 HRS): SARS Coronavirus 2: NEGATIVE

## 2020-01-31 ENCOUNTER — Encounter (HOSPITAL_COMMUNITY): Payer: Self-pay | Admitting: Internal Medicine

## 2020-01-31 ENCOUNTER — Ambulatory Visit (HOSPITAL_COMMUNITY): Payer: No Typology Code available for payment source | Admitting: Anesthesiology

## 2020-01-31 ENCOUNTER — Ambulatory Visit (HOSPITAL_COMMUNITY)
Admission: RE | Admit: 2020-01-31 | Discharge: 2020-01-31 | Disposition: A | Discharge status: Home / Self Care | Payer: No Typology Code available for payment source | Attending: Internal Medicine | Admitting: Internal Medicine

## 2020-01-31 ENCOUNTER — Encounter (HOSPITAL_COMMUNITY)
Admission: RE | Disposition: A | Discharge status: Home / Self Care | Payer: Self-pay | Source: Home / Self Care | Attending: Internal Medicine

## 2020-01-31 DIAGNOSIS — R1013 Epigastric pain: Secondary | ICD-10-CM | POA: Diagnosis not present

## 2020-01-31 DIAGNOSIS — R1310 Dysphagia, unspecified: Secondary | ICD-10-CM

## 2020-01-31 DIAGNOSIS — K219 Gastro-esophageal reflux disease without esophagitis: Secondary | ICD-10-CM | POA: Insufficient documentation

## 2020-01-31 DIAGNOSIS — Z79899 Other long term (current) drug therapy: Secondary | ICD-10-CM | POA: Diagnosis not present

## 2020-01-31 DIAGNOSIS — Z8616 Personal history of COVID-19: Secondary | ICD-10-CM | POA: Insufficient documentation

## 2020-01-31 HISTORY — PX: ESOPHAGOGASTRODUODENOSCOPY (EGD) WITH PROPOFOL: SHX5813

## 2020-01-31 HISTORY — PX: MALONEY DILATION: SHX5535

## 2020-01-31 LAB — PREGNANCY, URINE: Preg Test, Ur: NEGATIVE

## 2020-01-31 SURGERY — ESOPHAGOGASTRODUODENOSCOPY (EGD) WITH PROPOFOL
Anesthesia: General

## 2020-01-31 MED ORDER — LACTATED RINGERS IV SOLN: Freq: Once | INTRAVENOUS | Status: AC

## 2020-01-31 MED ORDER — PROPOFOL 500 MG/50ML IV EMUL
INTRAVENOUS | Status: DC | PRN
  Administered 2020-01-31: 150 ug/kg/min via INTRAVENOUS

## 2020-01-31 MED ORDER — GLYCOPYRROLATE 0.2 MG/ML IJ SOLN
INTRAMUSCULAR | Status: DC | PRN
  Administered 2020-01-31: .2 mg via INTRAVENOUS

## 2020-01-31 MED ORDER — PROPOFOL 10 MG/ML IV BOLUS
INTRAVENOUS | Status: AC
  Filled 2020-01-31: qty 60

## 2020-01-31 MED ORDER — KETAMINE HCL 50 MG/5ML IJ SOSY
PREFILLED_SYRINGE | INTRAMUSCULAR | Status: AC
  Filled 2020-01-31: qty 5

## 2020-01-31 MED ORDER — LIDOCAINE 2% (20 MG/ML) 5 ML SYRINGE
INTRAMUSCULAR | Status: AC
  Filled 2020-01-31: qty 10

## 2020-01-31 MED ORDER — KETAMINE HCL 10 MG/ML IJ SOLN
INTRAMUSCULAR | Status: DC | PRN
  Administered 2020-01-31: 20 mg via INTRAVENOUS

## 2020-01-31 MED ORDER — LACTATED RINGERS IV SOLN: INTRAVENOUS | Status: DC | PRN

## 2020-01-31 MED ORDER — SUCCINYLCHOLINE CHLORIDE 200 MG/10ML IV SOSY
PREFILLED_SYRINGE | INTRAVENOUS | Status: AC
  Filled 2020-01-31: qty 10

## 2020-01-31 MED ORDER — LIDOCAINE HCL (CARDIAC) PF 100 MG/5ML IV SOSY
PREFILLED_SYRINGE | INTRAVENOUS | Status: DC | PRN
  Administered 2020-01-31: 100 mg via INTRAVENOUS

## 2020-01-31 NOTE — H&P (Signed)
@LOGO @   Primary Care Physician:  , MD Primary Gastroenterologist:  Dr. Ardyth Man  Pre-Procedure History & Physical: HPI:  Kathryn Hurley is a 44 y.o. female here for further evaluation of dyspepsia epigastric burning.  Chest pain radiating through her back from time to time.  Symptoms started after she was having episodes of severe coughing with COVID-19 in January of this year.  Denies any odynophagia dysphagia at this time.  Continues on omeprazole 20 mg daily and famotidine as needed.  Could not afford Dexilant.  Past Medical History:  Diagnosis Date  . Acid reflux   . Constipation     Past Surgical History:  Procedure Laterality Date  . BIOPSY N/A 01/08/2016   Procedure: BIOPSY;  Surgeon: 01/10/2016, MD;  Location: [facility];  Service: Endoscopy;  Laterality: N/A;  Antral erosions  . CHOLECYSTECTOMY    . COLONOSCOPY N/A 01/08/2016   RMR: normal  . ESOPHAGOGASTRODUODENOSCOPY N/A 01/08/2016   RMR: Gastric erosion s/p biopsy with negative H.pylori.   01/10/2016 FLEXIBLE SIGMOIDOSCOPY N/A 11/05/2019   Dr. 11/07/2019: normal, suspect occult anal fissure    Prior to Admission medications   Medication Sig Start Date End Date Taking? Authorizing Provider  famotidine (PEPCID) 20 MG tablet Take 20 mg by mouth at bedtime.   Yes [provider]  Multiple Vitamin (MULTIVITAMIN WITH MINERALS) TABS tablet Take 1 tablet by mouth daily.   Yes [provider]  NONFORMULARY OR COMPOUNDED ITEM Place 1 application rectally 4 (four) times daily as needed (anal fissure). Diltiazem 2% and Lidocaine 5%   Yes [provider]  omeprazole (PRILOSEC) 20 MG capsule Take 1 po BID x 2 weeks then once a day Patient taking differently: Take 20 mg by mouth every morning.  11/24/19  Yes 01/22/20, MD  sucralfate (CARAFATE) 1 g tablet Take 1 tablet (1 g total) by mouth 4 (four) times daily -  with meals and at bedtime. Patient not taking: Reported on 12/24/2019 12/10/19   12/12/19, NP   dicyclomine (BENTYL) 10 MG capsule Take 1 capsule (10 mg total) by mouth 4 (four) times daily -  before meals and at bedtime. As needed for abdominal cramping 07/05/19 11/26/19  01/24/20, NP    Allergies as of 12/10/2019  . (No Known Allergies)    Family History  Problem Relation Age of Onset  . Colon cancer Neg Hx     Social History   Socioeconomic History  . Marital status: Married    Spouse name: Not on file  . Number of children: Not on file  . Years of education: Not on file  . Highest education level: Not on file  Occupational History  . Not on file  Tobacco Use  . Smoking status: Never Smoker  . Smokeless tobacco: Never Used  Substance and Sexual Activity  . Alcohol use: No    Alcohol/week: 0.0 standard drinks  . Drug use: No  . Sexual activity: Yes  Other Topics Concern  . Not on file  Social History Narrative  . Not on file   Social Determinants of Health   Financial Resource Strain:   . Difficulty of Paying Living Expenses:   Food Insecurity:   . Worried About 12/12/2019 in the Last Year:   . Programme researcher, broadcasting/film/video in the Last Year:   Transportation Needs:   . Barista (Medical):   Freight forwarder Lack of Transportation (Non-Medical):   Physical Activity:   .  Days of Exercise per Week:   . Minutes of Exercise per Session:   Stress:   . Feeling of Stress :   Social Connections:   . Frequency of Communication with Friends and Family:   . Frequency of Social Gatherings with Friends and Family:   . Attends Religious Services:   . Active Member of Clubs or Organizations:   . Attends Archivist Meetings:   Marland Kitchen Marital Status:   Intimate Partner Violence:   . Fear of Current or Ex-Partner:   . Emotionally Abused:   Marland Kitchen Physically Abused:   . Sexually Abused:     Review of Systems: See HPI, otherwise negative ROS  Physical Exam: BP 117/72   Pulse 80   Temp 98.4 F (36.9 C) (Oral)   Resp 20   Ht 5\' 6"  (1.676 m)   Wt 90.3 kg    SpO2 97%   BMI 32.12 kg/m  General:   Alert,  Well-developed, well-nourished, pleasant and cooperative in NAD Neck:  Supple; no masses or thyromegaly. No significant cervical adenopathy. Lungs:  Clear throughout to auscultation.   No wheezes, crackles, or rhonchi. No acute distress. Heart:  Regular rate and rhythm; no murmurs, clicks, rubs,  or gallops. Abdomen: Non-distended, normal bowel sounds.  Soft and nontender without appreciable mass or hepatosplenomegaly.  Pulses:  Normal pulses noted. Extremities:  Without clubbing or edema.  Impression/Plan: 44 year old lady with worsening dyspepsia some transient odynophagia dysphagia now resolved.  Describes reflux symptoms severe at times.  Pleuritic component.  Onset of symptoms after severe coughing in the setting of COVID-19 in January of this year.  Recommendations of offer the patient a diagnostic EGD  The risks, benefits, limitations, alternatives and imponderables have been reviewed with the patient. Potential for esophageal dilation, biopsy, etc. have also been reviewed.  Questions have been answered. All parties agreeable.     Notice: This dictation was prepared with Dragon dictation along with smaller phrase technology. Any transcriptional errors that result from this process are unintentional and may not be corrected upon review.

## 2020-01-31 NOTE — Op Note (Signed)
[facility] Patient Name: Kathryn Hurley Procedure Date: 01/31/2020 9:06 AM MRN: [id number] Date of Birth: 1975/12/29 Attending MD: Gennette Pac , MD CSN: [id number] Age: 44 Admit Type: Outpatient Procedure:                Upper GI endoscopy Indications:              Dyspepsia, Dysphagia Providers:                Gennette Pac, MD, Criselda Peaches. Patsy Lager, RN,                            Pandora Leiter, Technician Referring MD:              Medicines:                Propofol per Anesthesia Complications:            No immediate complications. Estimated Blood Loss:     Estimated blood loss: none. Procedure:                Pre-Anesthesia Assessment:                           - Prior to the procedure, a History and Physical                            was performed, and patient medications and                            allergies were reviewed. The patient's tolerance of                            previous anesthesia was also reviewed. The risks                            and benefits of the procedure and the sedation                            options and risks were discussed with the patient.                            All questions were answered, and informed consent                            was obtained. Prior Anticoagulants: The patient has                            taken no previous anticoagulant or antiplatelet                            agents. ASA Grade Assessment: II - A patient with                            mild systemic disease. After reviewing the risks  and benefits, the patient was deemed in                            satisfactory condition to undergo the procedure.                           After obtaining informed consent, the endoscope was                            passed under direct vision. Throughout the                            procedure, the patient's blood pressure, pulse, and                            oxygen  saturations were monitored continuously. The                            [id number]-[id number] ([id number]) scope was introduced through the                            mouth, and advanced to the second part of duodenum.                            The upper GI endoscopy was accomplished without                            difficulty. The patient tolerated the procedure                            well. Scope In: 9:26:19 AM Scope Out: 9:29:37 AM Total Procedure Duration: 0 hours 3 minutes 18 seconds  Findings:      The examined esophagus was normal.      The entire examined stomach was normal.      The duodenal bulb and second portion of the duodenum were normal. Impression:               - Normal esophagus.                           - Normal stomach.                           - Normal duodenal bulb and second portion of the                            duodenum. Onset of some of her symptoms started                            immediately after severe coughing episode with                            COVID-19 back in January. I do suspect a  musculoskeletal component. Moderate Sedation:      Moderate (conscious) sedation was personally administered by an       anesthesia professional. The following parameters were monitored: oxygen       saturation, heart rate, blood pressure, respiratory rate, EKG, adequacy       of pulmonary ventilation, and response to care. Recommendation:           - Patient has a contact number available for                            emergencies. The signs and symptoms of potential                            delayed complications were discussed with the                            patient. Return to normal activities tomorrow.                            Written discharge instructions were provided to the                            patient.                           - Advance diet as tolerated.                           - Continue present medications. For now,  stop                            omeprazole; trial of Dexilant 60 mg daily x2 weeks.                            Samples awaiting her at the office after discharge                            today.                           - Return to my office in 6 weeks. Procedure Code(s):        --- Professional ---                           [phone], Esophagogastroduodenoscopy, flexible,                            transoral; with biopsy, single or multiple Diagnosis Code(s):        --- Professional ---                           R10.13, Epigastric pain                           R13.10, Dysphagia, unspecified CPT copyright 2019 American Medical Association. All rights reserved. The codes documented in this report are preliminary and  upon coder review may  be revised to meet current compliance requirements. Cristopher Estimable. Kathaleya Mcduffee, MD Norvel Richards, MD 01/31/2020 9:40:15 AM This report has been signed electronically. Number of Addenda: 0

## 2020-01-31 NOTE — Anesthesia Preprocedure Evaluation (Signed)
Anesthesia Evaluation  Patient identified by MRN, date of birth, ID band Patient awake    Reviewed: Allergy & Precautions, NPO status , Patient's Chart, lab work & pertinent test results  History of Anesthesia Complications Negative for: history of anesthetic complications  Airway Mallampati: II  TM Distance: >3 FB Neck ROM: Full    Dental  (+) Missing, Chipped, Dental Advisory Given   Pulmonary neg pulmonary ROS,    Pulmonary exam normal breath sounds clear to auscultation       Cardiovascular Exercise Tolerance: Good Normal cardiovascular exam Rhythm:Regular Rate:Normal     Neuro/Psych negative neurological ROS  negative psych ROS   GI/Hepatic Neg liver ROS, hiatal hernia, GERD  Medicated,  Endo/Other  negative endocrine ROS  Renal/GU negative Renal ROS  negative genitourinary   Musculoskeletal negative musculoskeletal ROS (+)   Abdominal   Peds negative pediatric ROS (+)  Hematology negative hematology ROS (+)   Anesthesia Other Findings   Reproductive/Obstetrics                            Anesthesia Physical Anesthesia Plan  ASA: II  Anesthesia Plan: General   Post-op Pain Management:    Induction: Intravenous  PONV Risk Score and Plan: 2 and TIVA  Airway Management Planned: Nasal Cannula, Natural Airway and Simple Face Mask  Additional Equipment:   Intra-op Plan:   Post-operative Plan:   Informed Consent: I have reviewed the patients History and Physical, chart, labs and discussed the procedure including the risks, benefits and alternatives for the proposed anesthesia with the patient or authorized representative who has indicated his/her understanding and acceptance.     Dental advisory given  Plan Discussed with: CRNA and Surgeon  Anesthesia Plan Comments:         Anesthesia Quick Evaluation

## 2020-01-31 NOTE — Anesthesia Postprocedure Evaluation (Signed)
Anesthesia Post Note  Patient: Kathryn Hurley  Procedure(s) Performed: ESOPHAGOGASTRODUODENOSCOPY (EGD) WITH PROPOFOL (N/A ) MALONEY DILATION (N/A )  Patient location during evaluation: PACU Anesthesia Type: General Level of consciousness: awake and alert Pain management: pain level controlled Vital Signs Assessment: post-procedure vital signs reviewed and stable Respiratory status: spontaneous breathing Cardiovascular status: stable Postop Assessment: no apparent nausea or vomiting Anesthetic complications: no     Last Vitals:  Vitals:   01/31/20 0751 01/31/20 0818  BP: (P) 115/74 117/72  Pulse: (P) 93 80  Resp: (P) 18 20  Temp: (P) 36.8 C 36.9 C  SpO2:  97%    Last Pain:  Vitals:   01/31/20 0921  TempSrc:   PainSc: 4                  Edison Pace

## 2020-01-31 NOTE — Transfer of Care (Signed)
Immediate Anesthesia Transfer of Care Note  Patient: Lucila Maine  Procedure(s) Performed: ESOPHAGOGASTRODUODENOSCOPY (EGD) WITH PROPOFOL (N/A ) MALONEY DILATION (N/A )  Patient Location: PACU  Anesthesia Type:General  Level of Consciousness: awake, alert , oriented and patient cooperative  Airway & Oxygen Therapy: Patient Spontanous Breathing  Post-op Assessment: Report given to RN and Post -op Vital signs reviewed and stable  Post vital signs: Reviewed and stable  Last Vitals:  Vitals Value Taken Time  BP    Temp    Pulse 90 01/31/20 0940  Resp 12 01/31/20 0940  SpO2 100 % 01/31/20 0940  Vitals shown include unvalidated device data.  Last Pain:  Vitals:   01/31/20 0921  TempSrc:   PainSc: 4       Patients Stated Pain Goal: 6 (01/31/20 0818)  Complications: No apparent anesthesia complications

## 2020-01-31 NOTE — Discharge Instructions (Signed)
EGD Discharge instructions Please read the instructions outlined below and refer to this sheet in the next few weeks. These discharge instructions provide you with general information on caring for yourself after you leave the hospital. Your doctor may also give you specific instructions. While your treatment has been planned according to the most current medical practices available, unavoidable complications occasionally occur. If you have any problems or questions after discharge, please call your doctor. ACTIVITY  You may resume your regular activity but move at a slower pace for the next 24 hours.   Take frequent rest periods for the next 24 hours.   Walking will help expel (get rid of) the air and reduce the bloated feeling in your abdomen.   No driving for 24 hours (because of the anesthesia (medicine) used during the test).   You may shower.   Do not sign any important legal documents or operate any machinery for 24 hours (because of the anesthesia used during the test).  NUTRITION  Drink plenty of fluids.   You may resume your normal diet.   Begin with a light meal and progress to your normal diet.   Avoid alcoholic beverages for 24 hours or as instructed by your caregiver.  MEDICATIONS  You may resume your normal medications unless your caregiver tells you otherwise.  WHAT YOU CAN EXPECT TODAY  You may experience abdominal discomfort such as a feeling of fullness or gas pains.  FOLLOW-UP  Your doctor will discuss the results of your test with you.  SEEK IMMEDIATE MEDICAL ATTENTION IF ANY OF THE FOLLOWING OCCUR:  Excessive nausea (feeling sick to your stomach) and/or vomiting.   Severe abdominal pain and distention (swelling).   Trouble swallowing.   Temperature over 101 F (37.8 C).   Rectal bleeding or vomiting of blood.    Your endoscopy was normal today  I suspect some of your discomfort going into your back is related to muscle strain from severe  coughing 2 months ago.  This can persist for quite a while before it goes away  Stop omeprazole; begin Dexilant 60 mg daily x2 weeks.  Go by my office where we have free samples waiting for you  Call me in 2 weeks and let me know how you are doing  Office visit with [place] in 6 weeks  At patient request, I called Christopher at [phone] - no contact  PATIENT INSTRUCTIONS POST-ANESTHESIA  IMMEDIATELY FOLLOWING SURGERY:  Do not drive or operate machinery for the first twenty four hours after surgery.  Do not make any important decisions for twenty four hours after surgery or while taking narcotic pain medications or sedatives.  If you develop intractable nausea and vomiting or a severe headache please notify your doctor immediately.  FOLLOW-UP:  Please make an appointment with your surgeon as instructed. You do not need to follow up with anesthesia unless specifically instructed to do so.  WOUND CARE INSTRUCTIONS (if applicable):  Keep a dry clean dressing on the anesthesia/puncture wound site if there is drainage.  Once the wound has quit draining you may leave it open to air.  Generally you should leave the bandage intact for twenty four hours unless there is drainage.  If the epidural site drains for more than 36-48 hours please call the anesthesia department.  QUESTIONS?:  Please feel free to call your physician or the hospital operator if you have any questions, and they will be happy to assist you.

## 2020-02-06 ENCOUNTER — Encounter: Payer: Self-pay | Admitting: General Surgery

## 2020-02-06 ENCOUNTER — Ambulatory Visit (INDEPENDENT_AMBULATORY_CARE_PROVIDER_SITE_OTHER): Payer: Self-pay | Admitting: General Surgery

## 2020-02-06 ENCOUNTER — Other Ambulatory Visit: Payer: Self-pay

## 2020-02-06 VITALS — BP 116/70 | HR 88 | Temp 97.9°F | Resp 12 | Ht 66.0 in | Wt 198.0 lb

## 2020-02-06 DIAGNOSIS — K600 Acute anal fissure: Secondary | ICD-10-CM

## 2020-02-06 NOTE — Patient Instructions (Signed)
Functional bowel disorders: [email or web address] Search- Frankey Poot, MD  Use cream for fissure as needed after sharp/ painful BM.  Try an "anti-inflammatory diet" - some people have success with these diets to help stop pain

## 2020-02-07 NOTE — Progress Notes (Signed)
[facility] Note   HPI:  44 y.o. Female presents to clinic for follow-up evaluation of her fissure. She has been doing fair and has undergone an EGD and colonoscopy.  She says she continues to have abdominal pain and has daily BMs. She has not had any painful BMs or sharp pain in her anal region.    Review of Systems:  No anal pain  Continued abdominal pain Regular BMs All other review of systems: otherwise negative   Vital Signs:  BP 116/70   Pulse 88   Temp 97.9 F (36.6 C) (Oral)   Resp 12   Ht 5\' 6"  (1.676 m)   Wt 198 lb (89.8 kg)   LMP 01/11/2020   SpO2 98%   BMI 31.96 kg/m    Physical Exam:  Physical Exam Vitals reviewed.  HENT:     Head: Normocephalic.  Cardiovascular:     Rate and Rhythm: Normal rate.  Pulmonary:     Effort: Pulmonary effort is normal.  Abdominal:     General: There is no distension.     Palpations: Abdomen is soft.     Tenderness: There is no abdominal tenderness.  Genitourinary:    Rectum: No tenderness, anal fissure or external hemorrhoid.     Comments: No signs of fissure Musculoskeletal:        General: No swelling.  Neurological:     Mental Status: She is alert.      Assessment:  44 y.o. yo Female with a prior posterior fissure. Doing better with this after diltiazem/ lidocaine cream.   Plan:  Functional bowel disorders: [email or web address] Search- 59, MD  Use cream for fissure as needed after sharp/ painful BM.  Try an "anti-inflammatory diet" - some people have success with these diets to help stop pain  All of the above recommendations were discussed with the patient, and all of patient's questions were answered to her expressed satisfaction.  Frankey Poot, MD [facility] [address] [place], [place] [phone] (office)

## 2020-02-14 ENCOUNTER — Emergency Department (HOSPITAL_COMMUNITY): Payer: No Typology Code available for payment source

## 2020-02-14 ENCOUNTER — Encounter (HOSPITAL_COMMUNITY): Payer: Self-pay

## 2020-02-14 ENCOUNTER — Other Ambulatory Visit: Payer: Self-pay

## 2020-02-14 ENCOUNTER — Emergency Department (HOSPITAL_COMMUNITY)
Admission: EM | Admit: 2020-02-14 | Discharge: 2020-02-14 | Disposition: A | Discharge status: Home / Self Care | Payer: No Typology Code available for payment source | Attending: Emergency Medicine | Admitting: Emergency Medicine

## 2020-02-14 DIAGNOSIS — M549 Dorsalgia, unspecified: Secondary | ICD-10-CM | POA: Insufficient documentation

## 2020-02-14 DIAGNOSIS — R6889 Other general symptoms and signs: Secondary | ICD-10-CM | POA: Diagnosis not present

## 2020-02-14 DIAGNOSIS — Z79899 Other long term (current) drug therapy: Secondary | ICD-10-CM | POA: Diagnosis not present

## 2020-02-14 DIAGNOSIS — R0789 Other chest pain: Secondary | ICD-10-CM | POA: Diagnosis present

## 2020-02-14 DIAGNOSIS — Z8616 Personal history of COVID-19: Secondary | ICD-10-CM | POA: Diagnosis not present

## 2020-02-14 LAB — URINALYSIS, ROUTINE W REFLEX MICROSCOPIC
Bilirubin Urine: NEGATIVE
Glucose, UA: NEGATIVE mg/dL
Hgb urine dipstick: NEGATIVE
Ketones, ur: 20 mg/dL — AB
Leukocytes,Ua: NEGATIVE
Nitrite: NEGATIVE
Protein, ur: NEGATIVE mg/dL
Specific Gravity, Urine: 1.012 (ref 1.005–1.030)
pH: 6 (ref 5.0–8.0)

## 2020-02-14 LAB — CBC
HCT: 43.4 % (ref 36.0–46.0)
Hemoglobin: 14.6 g/dL (ref 12.0–15.0)
MCH: 32.8 pg (ref 26.0–34.0)
MCHC: 33.6 g/dL (ref 30.0–36.0)
MCV: 97.5 fL (ref 80.0–100.0)
Platelets: 222 10*3/uL (ref 150–400)
RBC: 4.45 MIL/uL (ref 3.87–5.11)
RDW: 12.1 % (ref 11.5–15.5)
WBC: 4.7 10*3/uL (ref 4.0–10.5)
nRBC: 0 % (ref 0.0–0.2)

## 2020-02-14 LAB — BASIC METABOLIC PANEL
Anion gap: 11 (ref 5–15)
BUN: 6 mg/dL (ref 6–20)
CO2: 24 mmol/L (ref 22–32)
Calcium: 9.5 mg/dL (ref 8.9–10.3)
Chloride: 105 mmol/L (ref 98–111)
Creatinine, Ser: 0.73 mg/dL (ref 0.44–1.00)
GFR calc Af Amer: >60 mL/min (ref >60)
GFR calc non Af Amer: >60 mL/min (ref >60)
Glucose, Bld: 93 mg/dL (ref 70–99)
Potassium: 4.3 mmol/L (ref 3.5–5.1)
Sodium: 140 mmol/L (ref 135–145)

## 2020-02-14 LAB — I-STAT BETA HCG BLOOD, ED (MC, WL, AP ONLY): I-stat hCG, quantitative: <5 m[IU]/mL (ref <5)

## 2020-02-14 LAB — TROPONIN I (HIGH SENSITIVITY): Troponin I (High Sensitivity): <2 ng/L (ref <18)

## 2020-02-14 NOTE — ED Provider Notes (Signed)
[facility] EMERGENCY DEPARTMENT Provider Note   CSN: [id number] Arrival date & time: 02/14/20  1136     History Chief Complaint  Patient presents with  . Chest Pain  . Back Pain    Kathryn Hurley is a 44 y.o. female.  Patient is a 44 year old female with past medical history of GERD presenting to the emergency department for multiple complaints.  Patient reports that over the last month she has had multiple complaints including full body chills, back pain, chest pain, shortness of breath, sore throat, blurry vision and "numbness in my head", constipation, lower abdominal pain, tinnitus.  She reports that she has seen her primary care doctor multiple times for this.  She had COVID-19 in January and was tested for COVID-19 again and her primary care doctor's office.  Reports that initially her doctor gave her antibiotics but this caused worsening upset stomach so she stopped taking them.  She reports that now her primary care doctor has given her prescription for Xanax which she has not taken because she feels that she does not need it.  She reports that every time she eats something she feels acid reflux symptoms.  She has seen GI for the symptoms in the past and reports having a normal endoscopy.  She denies any fever, vomiting, diarrhea, dysuria.  Reports her symptoms seem to be worse at nighttime when she was trying to sleep.  No other exacerbating or relieving factors.        Past Medical History:  Diagnosis Date  . Acid reflux   . Constipation     Patient Active Problem List   Diagnosis Date Noted  . Acute posterior anal fissure 12/19/2019  . Dysphagia 12/10/2019  . Lower abdominal pain 09/13/2019  . Abdominal cramping 07/05/2019  . Rectal pain 07/05/2019  . Grade I hemorrhoids 01/16/2019  . Grade II hemorrhoids 12/10/2018  . Constipation 10/10/2018  . Diverticulosis of colon without hemorrhage   . Mucosal abnormality of stomach   . Hiatal hernia   .  Dyspepsia 12/18/2015  . GERD (gastroesophageal reflux disease) 10/21/2015  . Rectal bleeding 10/21/2015  . Rectal pain, chronic 10/21/2015    Past Surgical History:  Procedure Laterality Date  . BIOPSY N/A 01/08/2016   Procedure: BIOPSY;  Surgeon: Corbin Ade, MD;  [facility];  Service: Endoscopy;  Laterality: N/A;  Antral erosions  . CHOLECYSTECTOMY    . COLONOSCOPY N/A 01/08/2016   RMR: normal  . ESOPHAGOGASTRODUODENOSCOPY N/A 01/08/2016   RMR: Gastric erosion s/p biopsy with negative H.pylori.   . ESOPHAGOGASTRODUODENOSCOPY (EGD) WITH PROPOFOL N/A 01/31/2020   Procedure: ESOPHAGOGASTRODUODENOSCOPY (EGD) WITH PROPOFOL;  Surgeon: Corbin Ade, MD;  Location[facility];  Service: Endoscopy;  Laterality: N/A;  9:15am  . FLEXIBLE SIGMOIDOSCOPY N/A 11/05/2019   Dr. Jena Gauss: normal, suspect occult anal fissure  . MALONEY DILATION N/A 01/31/2020   Procedure: Elease Hashimoto DILATION;  Surgeon: Corbin Ade, MD;  Location[facility];  Service: Endoscopy;  Laterality: N/A;     OB History   No obstetric history on file.     Family History  Problem Relation Age of Onset  . Colon cancer Neg Hx     Social History   Tobacco Use  . Smoking status: Never Smoker  . Smokeless tobacco: Never Used  Substance Use Topics  . Alcohol use: No    Alcohol/week: 0.0 standard drinks  . Drug use: No    Home Medications Prior to Admission medications  Medication Sig Start Date End Date Taking? Authorizing Provider  dexlansoprazole (DEXILANT) 60 MG capsule Take 60 mg by mouth daily.    [provider]  NONFORMULARY OR COMPOUNDED ITEM Place 1 application rectally 4 (four) times daily as needed (anal fissure). Diltiazem 2% and Lidocaine 5%    [provider]  dicyclomine (BENTYL) 10 MG capsule Take 1 capsule (10 mg total) by mouth 4 (four) times daily -  before meals and at bedtime. As needed for abdominal cramping 07/05/19 11/26/19  Gelene Mink, NP    Allergies     Patient has no known allergies.  Review of Systems   Review of Systems  Constitutional: Positive for appetite change and chills. Negative for diaphoresis, fatigue and unexpected weight change.  HENT: Positive for sore throat and tinnitus. Negative for congestion and rhinorrhea.   Eyes: Positive for visual disturbance. Negative for photophobia.  Respiratory: Positive for shortness of breath.   Cardiovascular: Positive for chest pain. Negative for palpitations and leg swelling.  Gastrointestinal: Positive for constipation. Negative for abdominal pain, diarrhea, nausea and vomiting.  Endocrine: Negative for polyuria.  Genitourinary: Negative for difficulty urinating, dysuria, menstrual problem, pelvic pain and urgency.  Musculoskeletal: Positive for back pain. Negative for arthralgias, myalgias, neck pain and neck stiffness.  Skin: Negative for rash and wound.  Allergic/Immunologic: Negative for immunocompromised state.  Neurological: Positive for numbness and headaches. Negative for dizziness, tremors, seizures, syncope, facial asymmetry, speech difficulty and light-headedness.  Hematological: Does not bruise/bleed easily.  Psychiatric/Behavioral: Positive for sleep disturbance. Negative for hallucinations. The patient is not nervous/anxious.     Physical Exam Updated Vital Signs BP 105/69   Pulse 73   Temp (!) 97.4 F (36.3 C) (Oral)   Resp 19   Ht 5\' 6"  (1.676 m)   Wt 88.9 kg   SpO2 100%   BMI 31.64 kg/m   Physical Exam Vitals and nursing note reviewed.  Constitutional:      General: She is not in acute distress.    Appearance: Normal appearance. She is well-developed. She is not ill-appearing, toxic-appearing or diaphoretic.  HENT:     Head: Normocephalic and atraumatic.     Jaw: There is normal jaw occlusion.     Mouth/Throat:     Mouth: Mucous membranes are moist.     Pharynx: Oropharynx is clear. Uvula midline.  Eyes:     Extraocular Movements: Extraocular  movements intact.     Conjunctiva/sclera: Conjunctivae normal.     Pupils: Pupils are equal, round, and reactive to light.  Cardiovascular:     Rate and Rhythm: Normal rate and regular rhythm.     Heart sounds: Normal heart sounds.  Pulmonary:     Effort: Pulmonary effort is normal.     Breath sounds: Normal breath sounds.  Chest:     Chest wall: No mass or tenderness.  Abdominal:     General: Bowel sounds are normal.     Palpations: Abdomen is soft.     Tenderness: There is no abdominal tenderness. There is no guarding or rebound.  Musculoskeletal:     Cervical back: Normal range of motion and neck supple.     Right lower leg: No edema.     Left lower leg: No edema.  Skin:    General: Skin is dry.  Neurological:     General: No focal deficit present.     Mental Status: She is alert and oriented to person, place, and time.     Cranial Nerves:  No cranial nerve deficit.  Psychiatric:        Mood and Affect: Mood normal.     ED Results / Procedures / Treatments   Labs (all labs ordered are listed, but only abnormal results are displayed) Labs Reviewed  URINALYSIS, ROUTINE W REFLEX MICROSCOPIC - Abnormal; Notable for the following components:      Result Value   Ketones, ur 20 (*)    All other components within normal limits  BASIC METABOLIC PANEL  CBC  I-STAT BETA HCG BLOOD, ED ([facility], [facility], [facility] ONLY)  TROPONIN I (HIGH SENSITIVITY)  TROPONIN I (HIGH SENSITIVITY)    EKG None  Radiology DG Chest 2 View  Result Date: 02/14/2020 CLINICAL DATA:  Chest pain EXAM: CHEST - 2 VIEW COMPARISON:  January 20, 2020 FINDINGS: Lungs are clear. Heart size and pulmonary vascularity are normal. No adenopathy. No pneumothorax. No bone lesions. IMPRESSION: No abnormality noted. No appreciable change from recent prior study. Electronically Signed   By: Lowella Grip III M.D.   On: 02/14/2020 12:21    Procedures Procedures (including critical care time)  Medications Ordered in  ED Medications - No data to display  ED Course  I have reviewed the triage vital signs and the nursing notes.  Pertinent labs & imaging results that were available during my care of the patient were reviewed by me and considered in my medical decision making (see chart for details).  Clinical Course as of Feb 14 1427  Fri Feb 14, 2020  1403 Patient with multiple symptoms over the last month. Very well appearing on exam with reassuring vitals, labs and imaging. Patient reassurance and advised to f/u with PMD. Advised on return precautions   [KM]    Clinical Course User Index [KM] Kristine Royal   MDM Rules/Calculators/A&P                      Based on review of vitals, medical screening exam, lab work and/or imaging, there does not appear to be an acute, emergent etiology for the patient's symptoms. Counseled pt on good return precautions and encouraged both PCP and ED follow-up as needed.  Prior to discharge, I also discussed incidental imaging findings with patient in detail and advised appropriate, recommended follow-up in detail.  Clinical Impression: 1. Multiple somatic complaints   2. Atypical chest pain     Disposition: Discharge  Prior to providing a prescription for a controlled substance, I independently reviewed the patient's recent prescription history on the [facility]. The patient had no recent or regular prescriptions and was deemed appropriate for a brief, less than 3 day prescription of narcotic for acute analgesia.  This note was prepared with assistance of Systems analyst. Occasional wrong-word or sound-a-like substitutions may have occurred due to the inherent limitations of voice recognition software.  Final Clinical Impression(s) / ED Diagnoses Final diagnoses:  Multiple somatic complaints  Atypical chest pain    Rx / DC Orders ED Discharge Orders    None       Kristine Royal 02/14/20 1428    Valarie Merino, MD 02/15/20 1255

## 2020-02-14 NOTE — ED Notes (Signed)
Pt verbalized understanding of discharge instructions. Follow up care reviewed, pt had no further questions. Pt ambulated to lobby independently.

## 2020-02-14 NOTE — ED Triage Notes (Signed)
Pt reports chest pain that radiates to her back that started 3 weeks ago. Pt seen for the same recently and given xanax for anxiety. Pt also c.o sore throat and ears clogged. Pt a.o, nad noted.

## 2020-02-14 NOTE — Discharge Instructions (Addendum)
You were seen today for multiple symptoms. Your lab work, urine, xray were all reassuring that there is no acute, emergent process today including no signs of infection. You should follow up with your primary care provider for additional testing. You may return to he ER for reevaluation if you have new or worsening symptoms.

## 2020-03-18 ENCOUNTER — Ambulatory Visit: Payer: No Typology Code available for payment source | Admitting: Gastroenterology

## 2020-04-28 ENCOUNTER — Other Ambulatory Visit: Payer: Self-pay

## 2020-04-28 ENCOUNTER — Encounter: Payer: Self-pay | Admitting: Gastroenterology

## 2020-04-28 ENCOUNTER — Ambulatory Visit (INDEPENDENT_AMBULATORY_CARE_PROVIDER_SITE_OTHER): Payer: Self-pay | Admitting: Gastroenterology

## 2020-04-28 ENCOUNTER — Telehealth: Payer: Self-pay

## 2020-04-28 VITALS — BP 105/74 | HR 90 | Temp 96.8°F | Ht 66.0 in | Wt 184.4 lb

## 2020-04-28 DIAGNOSIS — R109 Unspecified abdominal pain: Secondary | ICD-10-CM | POA: Insufficient documentation

## 2020-04-28 DIAGNOSIS — R202 Paresthesia of skin: Secondary | ICD-10-CM | POA: Insufficient documentation

## 2020-04-28 DIAGNOSIS — K219 Gastro-esophageal reflux disease without esophagitis: Secondary | ICD-10-CM

## 2020-04-28 NOTE — Telephone Encounter (Signed)
Received this referral for an esophageal manometry with Ph. Please review and advise on scheduling. She is on PPI. Thanks

## 2020-04-28 NOTE — Patient Instructions (Signed)
I am ordering a study to help further sort out your reflux symptoms and abdominal pain.  For now, continue omeprazole once each morning, 30 minutes before breakfast.  Please have blood work completed. This is checking multiple things such as vitamins and liver numbers.  We will see you in 3 months!  I enjoyed seeing you again today! As you know, I value our relationship and want to provide genuine, compassionate, and quality care. I welcome your feedback. If you receive a survey regarding your visit,  I greatly appreciate you taking time to fill this out. See you next time!  Gelene Mink, PhD, ANP-BC [facility]

## 2020-04-28 NOTE — Progress Notes (Signed)
Referring Provider: Ranae Palms, MD Primary Care Physician:  Ranae Palms, MD  Primary GI: Dr. Gala Romney   Chief Complaint  Patient presents with  . Abdominal Pain    all over but worse across top of abd  . Constipation  . Nausea    better after burping    HPI:   Kathryn Hurley is a 44 y.o. female presenting today with a history of dysphagia, chronic abdominal pain, anal fissure treated with supportive measures. Colonoscopy normal 2017, flex sig in Dec 2020 with suspected occult anal fissure, EGD in March 2021 completely normal. Gallbladder absent. Seen in ED April 2021 with numerous concerns. CT normal in Nov 2020.   Prilosec 20 mg daily. Has tried Dexilant without improvement. Every time eating something or drinking something will have back pain. Upper abdominal pain constantly. Worried about a bacteria. Right now with back pain. Had 2 apples this morning, small ones. Some water. When waking up, this is the only thing she can really eat otherwise pain is worse. Burns from esophagus all the way down to stomach, then starts burping a lot, then pain starts in her back. Feels like ulcers. Then will burn in lower abdomen. Even breasts get sore when she feels indigestion. Refractory GERD.   Has pain in lower abdomen as well. Ate crab meat and lemon made it worse. Taking apple cider vinegar in the morning.    Constipated at times but not currently. Doesn't want to take lots of medications because it upsets her stomach more. Bought enzymes from [facility] with probiotic, which has helped with constipation. Stool is productive. Still with lower abdominal discomfort chronically.   Numbness in hands and feet. Gets cramping in legs. Taking Vitamin B, magnesium, potassium a week ago.   Tearful. Burning with urination. PCP checked urine sample. Cramping lower abdomen. Was given Xanax and said she doesn't want to get addicted.   Weight loss noted from beginning of this year from 210 to 184.   Past Medical  History:  Diagnosis Date  . Acid reflux   . Constipation     Past Surgical History:  Procedure Laterality Date  . BIOPSY N/A 01/08/2016   Procedure: BIOPSY;  Surgeon: Daneil Dolin, MD;  [facility];  Service: Endoscopy;  Laterality: N/A;  Antral erosions  . CHOLECYSTECTOMY    . COLONOSCOPY N/A 01/08/2016   RMR: normal  . ESOPHAGOGASTRODUODENOSCOPY N/A 01/08/2016   RMR: Gastric erosion s/p biopsy with negative H.pylori.   . ESOPHAGOGASTRODUODENOSCOPY (EGD) WITH PROPOFOL N/A 01/31/2020   normal  . FLEXIBLE SIGMOIDOSCOPY N/A 11/05/2019   Dr. Gala Romney: normal, suspect occult anal fissure  . MALONEY DILATION N/A 01/31/2020   Procedure: Venia Minks DILATION;  Surgeon: Daneil Dolin, MD;  [facility];  Service: Endoscopy;  Laterality: N/A;    Current Outpatient Medications  Medication Sig Dispense Refill  . Multiple Vitamin (MULTIVITAMIN) tablet Take 1 tablet by mouth daily.    Marland Kitchen omeprazole (PRILOSEC) 20 MG capsule Take 20 mg by mouth daily.     No current facility-administered medications for this visit.    Allergies as of 04/28/2020  . (No Known Allergies)    Family History  Problem Relation Age of Onset  . Colon cancer Neg Hx     Social History   Socioeconomic History  . Marital status: Married    Spouse name: Not on file  . Number of children: Not on file  . Years of education: Not on file  .  Highest education level: Not on file  Occupational History  . Not on file  Tobacco Use  . Smoking status: Never Smoker  . Smokeless tobacco: Never Used  Vaping Use  . Vaping Use: Never used  Substance and Sexual Activity  . Alcohol use: No    Alcohol/week: 0.0 standard drinks  . Drug use: No  . Sexual activity: Yes  Other Topics Concern  . Not on file  Social History Narrative  . Not on file   Social Determinants of Health   Financial Resource Strain:   . Difficulty of Paying Living Expenses:   Food Insecurity:   . Worried About Brewing technologist in the Last Year:   . Barista in the Last Year:   Transportation Needs:   . Freight forwarder (Medical):   Marland Kitchen Lack of Transportation (Non-Medical):   Physical Activity:   . Days of Exercise per Week:   . Minutes of Exercise per Session:   Stress:   . Feeling of Stress :   Social Connections:   . Frequency of Communication with Friends and Family:   . Frequency of Social Gatherings with Friends and Family:   . Attends Religious Services:   . Active Member of Clubs or Organizations:   . Attends Banker Meetings:   Marland Kitchen Marital Status:     Review of Systems: Gen: see HPI CV: Denies chest pain, palpitations, syncope, peripheral edema, and claudication. Resp: Denies dyspnea at rest, cough, wheezing, coughing up blood, and pleurisy. GI: see HPI Derm: Denies rash, itching, dry skin Psych: Denies depression, anxiety, memory loss, confusion. No homicidal or suicidal ideation.  Heme: Denies bruising, bleeding, and enlarged lymph nodes.  Physical Exam: BP 105/74   Pulse 90   Temp (!) 96.8 F (36 C) (Temporal)   Ht 5\' 6"  (1.676 m)   Wt 184 lb 6.4 oz (83.6 kg)   LMP 04/14/2020 (Approximate)   BMI 29.76 kg/m  General:   Alert and oriented. Tearful and anxious.  Head:  Normocephalic and atraumatic. Eyes:  Conjuctiva clear without scleral icterus. Mouth:  Oral mucosa pink and moist. Good dentition. No lesions. Abdomen:  +BS, soft, TTP diffusely, multiple areas with just light palpation. Out of proportion to exam.  No HSM or masses noted. Msk:  Symmetrical without gross deformities. Normal posture. Extremities:  Without edema. Neurologic:  Alert and  oriented x4   ASSESSMENT: Kathryn Hurley is a 44 y.o. female presenting today with numerous concerns, previously followed for dysphagia, chronic abdominal pain, anal fissure that has responded well to supportive measures. Now with worsening GERD symptoms, abdominal pain, and numerous somatic complaints.    Refractory GERD: EGD essentially normal. Noting worsening symptoms with eating and drinking, resulting in esophageal burning, back and stomach pain, and she even reports her "breasts" burning.  Gallbladder absent, CT negative a few months ago, and she has not had adequate response to PPI. She is highly anxious and has had subsequent weight loss due to decreased oral intake in setting of worsening GERD. Dexilant without much improvement. Will continue omeprazole for now and pursue pH/manometry.   Abdominal pain: chronic. Lower abdominal pain at baseline. CT on file without concerning findings. Colonoscopy up-to-date. I wonder if she may benefit from a TCA in future. Would defer to PCP. May ultimately need tertiary evaluation.   Lower extremity paresthesias: numerous labs ordered. Further evaluation with PCP.      PLAN:  Continue omeprazole but increase to BID  PH/manometry  Multiple labs ordered today   3 month return   Gelene Mink, PhD, [facility]

## 2020-04-29 ENCOUNTER — Other Ambulatory Visit: Payer: Self-pay

## 2020-04-29 DIAGNOSIS — K219 Gastro-esophageal reflux disease without esophagitis: Secondary | ICD-10-CM

## 2020-04-29 DIAGNOSIS — Z1159 Encounter for screening for other viral diseases: Secondary | ICD-10-CM

## 2020-04-29 NOTE — Telephone Encounter (Signed)
We may be able to get more information if patient is willing hold PPI for 7 days prior to study. Please schedule esophageal manometry + pH impedance off PPI. Thanks

## 2020-04-29 NOTE — Telephone Encounter (Signed)
Scheduled for the first available 06/26/20. Letter and instructions will be mailed.

## 2020-05-01 ENCOUNTER — Telehealth: Payer: Self-pay | Admitting: Gastroenterology

## 2020-05-01 ENCOUNTER — Encounter: Payer: Self-pay | Admitting: Gastroenterology

## 2020-05-01 ENCOUNTER — Telehealth: Payer: Self-pay | Admitting: Internal Medicine

## 2020-05-01 NOTE — Telephone Encounter (Signed)
Patient wants the nurse to call her. [phone]

## 2020-05-01 NOTE — Telephone Encounter (Signed)
Let's increase omeprazole to BID for now. Please let patient know. Thanks!

## 2020-05-04 NOTE — Telephone Encounter (Signed)
Noted. See other phone note. Waiting to discuss with pt. Lmom,

## 2020-05-04 NOTE — Telephone Encounter (Signed)
Tried calling pt. VM is full.  

## 2020-05-04 NOTE — Telephone Encounter (Signed)
Called pt. VM is full. Another note is in pts chart from AB. Medication was sent in. Will call pt back.

## 2020-05-04 NOTE — Progress Notes (Signed)
Cc'ed to pcp °

## 2020-05-05 MED ORDER — OMEPRAZOLE 20 MG PO CPDR: 20.0000 mg | DELAYED_RELEASE_CAPSULE | Freq: Two times a day (BID) | ORAL | 3 refills | Status: AC

## 2020-05-05 NOTE — Telephone Encounter (Signed)
Spoke with pt. Pt is aware that AB wants her to take Omeprazole bid. AB, can you send this script to [facility] for pt. FYI pt wants to let AB know that she still plans to have her labs done. Pt started running a fever and has a throat infection per pts PCP. Pt is planing on have labs still completed when she is able to go to the lab.

## 2020-05-05 NOTE — Telephone Encounter (Signed)
Sent omeprazole BID to [facility].

## 2020-05-05 NOTE — Telephone Encounter (Signed)
Noted. Pt is aware that RX was sent. 

## 2020-05-05 NOTE — Addendum Note (Signed)
Addended by: Gelene Mink on: 05/05/2020 10:36 AM   Modules accepted: Orders

## 2020-06-16 ENCOUNTER — Telehealth: Payer: Self-pay | Admitting: Gastroenterology

## 2020-06-17 NOTE — Telephone Encounter (Signed)
ok 

## 2020-06-17 NOTE — Telephone Encounter (Signed)
Cancelled COVID testing and esophageal manometry per patient.  Dr. Lavon Paganini,  Just an FYI. Thank you.

## 2020-06-23 ENCOUNTER — Other Ambulatory Visit (HOSPITAL_COMMUNITY): Payer: PRIVATE HEALTH INSURANCE

## 2020-06-26 ENCOUNTER — Encounter (HOSPITAL_COMMUNITY): Payer: Self-pay

## 2020-06-26 ENCOUNTER — Ambulatory Visit (HOSPITAL_COMMUNITY): Admit: 2020-06-26 | Payer: PRIVATE HEALTH INSURANCE | Admitting: Gastroenterology

## 2020-06-26 SURGERY — MANOMETRY, ESOPHAGUS
Anesthesia: Choice

## 2020-07-29 ENCOUNTER — Ambulatory Visit: Payer: No Typology Code available for payment source | Admitting: Gastroenterology

## 2021-11-22 IMAGING — CT CT ANGIO CHEST
2 of 6 series · 17 of 46 positions shown · IV contrast (Omnipaque or Isovue)
Comparison: Radiograph same day

CLINICAL DATA: COVID positive shortness of breath

EXAM:
CT ANGIOGRAPHY CHEST WITH CONTRAST
TECHNIQUE: Multidetector CT imaging of the chest was performed using the
standard protocol during bolus administration of intravenous
contrast. Multiplanar CT image reconstructions and MIPs were
obtained to evaluate the vascular anatomy.
CONTRAST:  100mL OMNIPAQUE IOHEXOL 350 MG/ML SOLN

[Series 5: pe axial thins · axial · 0.66mm/px · z∈[+1234,+1457]mm · 14 of 245 slices shown]
[im 11/245  lung]
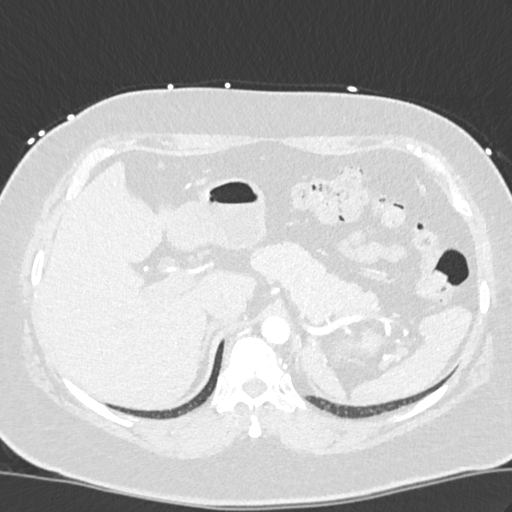
[im 32/245  soft-tissue]
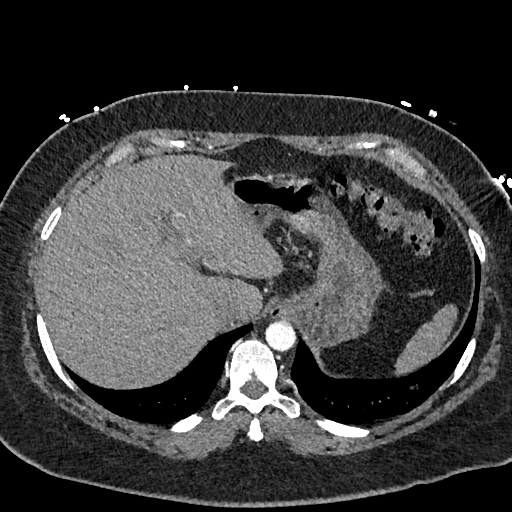
[im 43/245  lung]
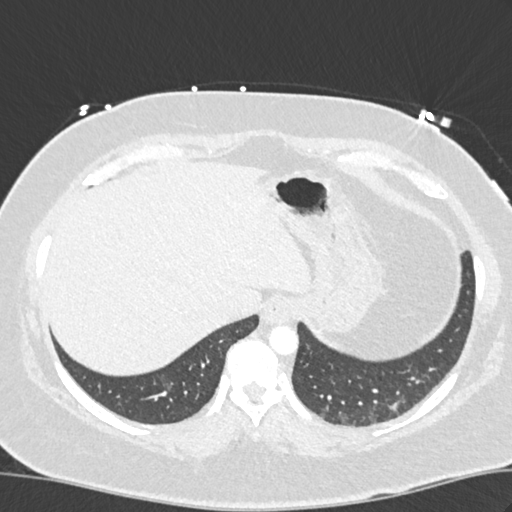
[im 64/245  soft-tissue]
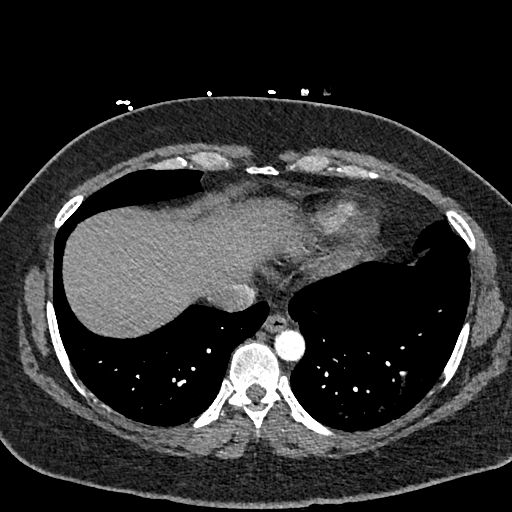
[im 85/245  lung]
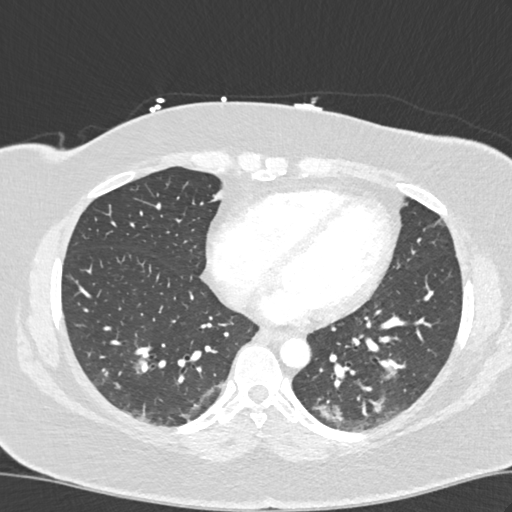
[im 96/245  soft-tissue]
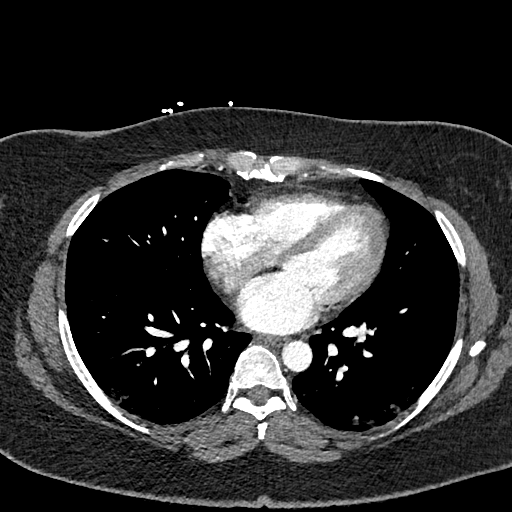
[im 117/245  lung]
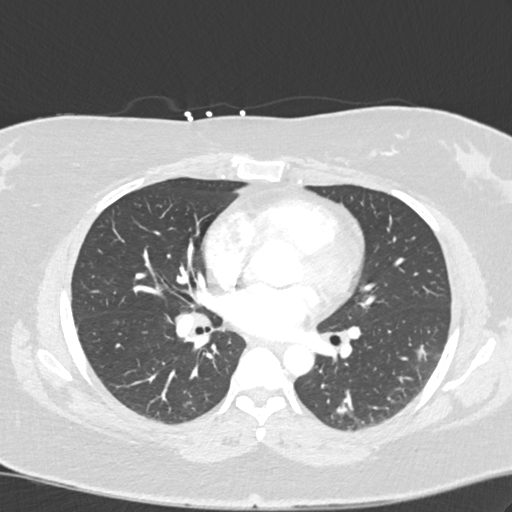
[im 128/245  soft-tissue]
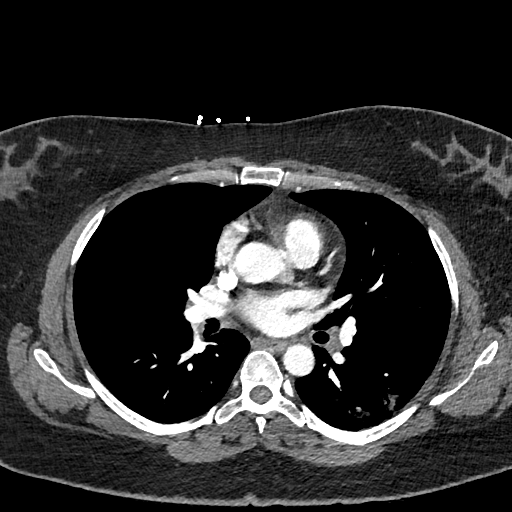
[im 149/245  lung]
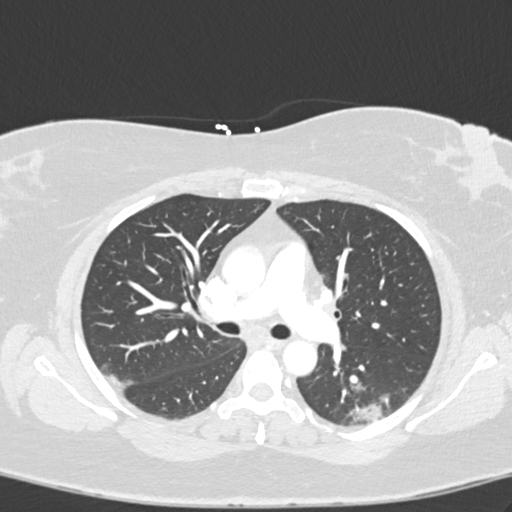
[im 160/245  soft-tissue]
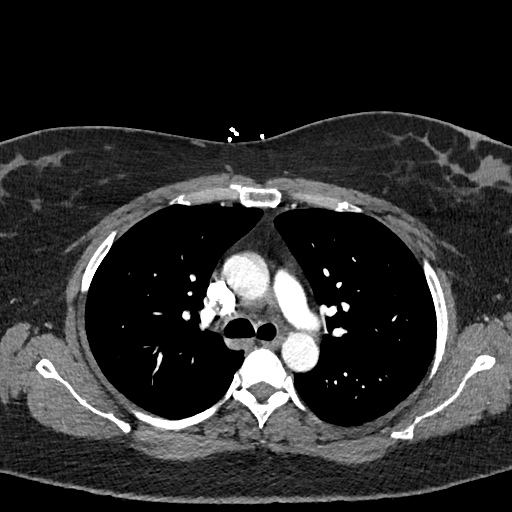
[im 181/245  lung]
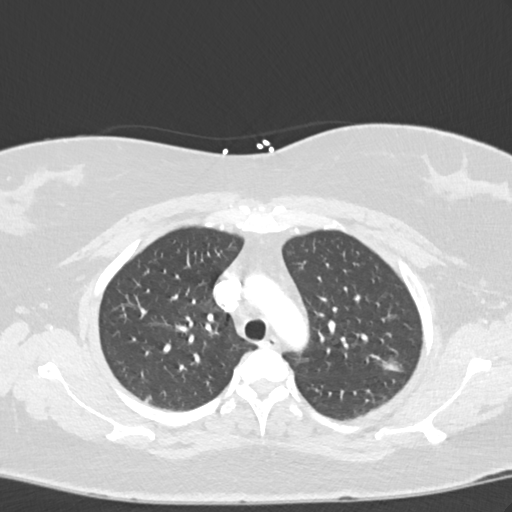
[im 202/245  soft-tissue]
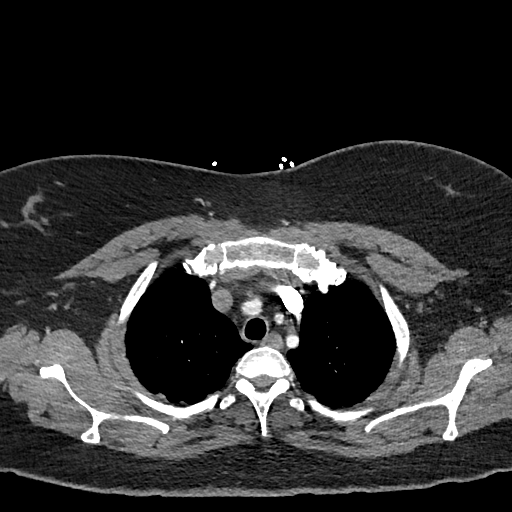
[im 213/245  lung]
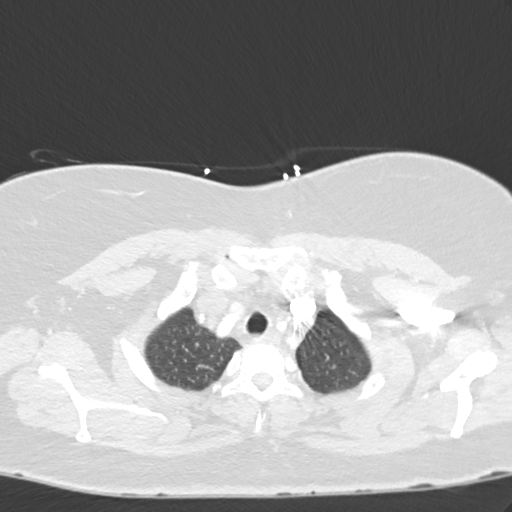
[im 234/245  soft-tissue]
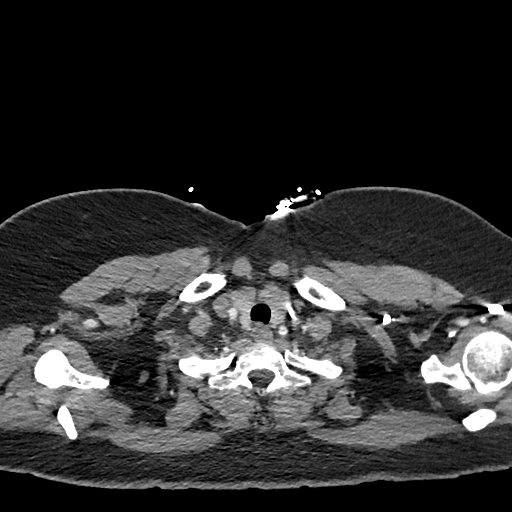

[Series 8: cor soft · coronal · 0.49mm/px · 3 of 117 slices shown]
[im 30/117  soft-tissue]
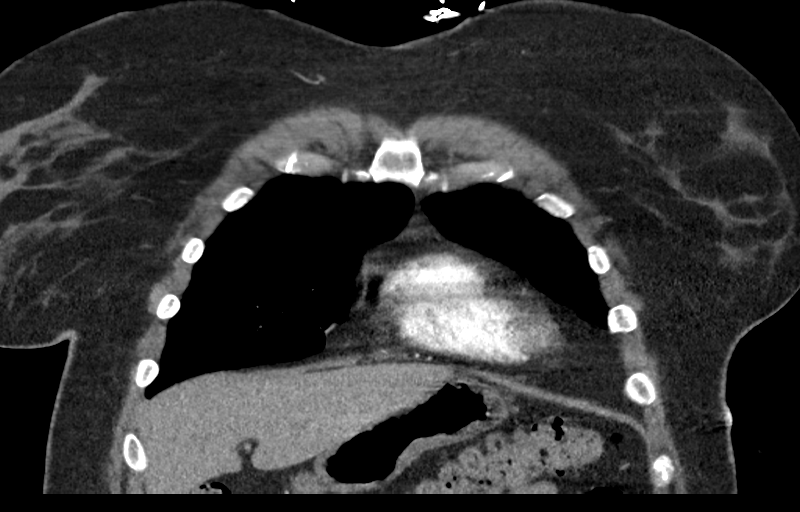
[im 59/117  soft-tissue]
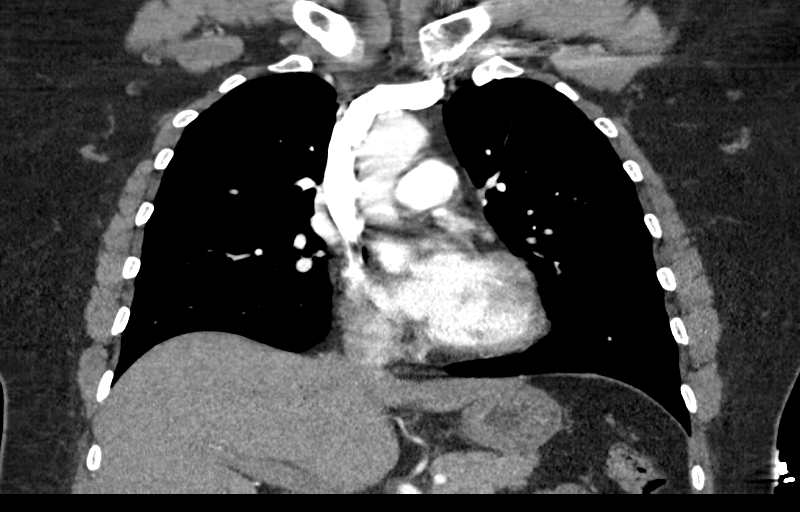
[im 88/117  soft-tissue]
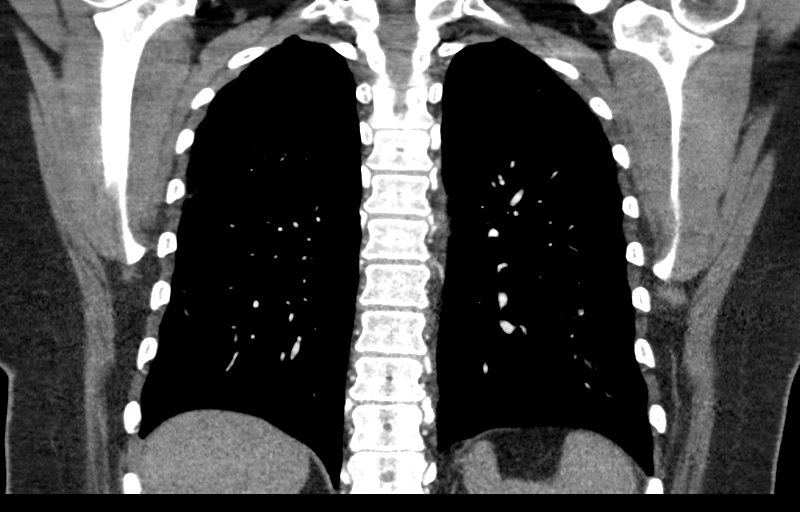

[17 of 46 positions shown; findings below may reference images not displayed]

FINDINGS: Cardiovascular: There is a optimal opacification of the pulmonary
arteries. There is no central,segmental, or subsegmental filling
defects within the pulmonary arteries. The heart is normal in size.
No pericardial effusion or thickening. No evidence right heart
strain. There is normal three-vessel brachiocephalic anatomy without
proximal stenosis. The thoracic aorta is normal in appearance.

Mediastinum/Nodes: No hilar, mediastinal, or axillary adenopathy.
Thyroid gland, trachea, and esophagus demonstrate no significant
findings.

Lungs/Pleura: Multifocal patchy airspace opacities are seen
throughout both lungs, predominantly within the periphery of the
lower lungs. No pleural effusion or pneumothorax is seen. No
airspace consolidation.

Upper Abdomen: No acute abnormalities present in the visualized
portions of the upper abdomen. Surgical clips seen within the
gallbladder bed.

Musculoskeletal: No chest wall abnormality. No acute or significant
osseous findings.

Review of the MIP images confirms the above findings.
IMPRESSION: No central, segmental, or subsegmental pulmonary embolism.

Multifocal patchy/ground-glass opacities throughout both lungs,
predominantly within the lung bases, consistent with COVID
pneumonia.
